# Patient Record
Sex: Female | Born: 1995 | Race: Black or African American | Hispanic: Yes | Marital: Single | Smoking: Never smoker
Health system: Southern US, Community
[De-identification: ages and names within clinical notes are randomized; demographics above are authoritative.]

## PROBLEM LIST (undated history)

## (undated) ENCOUNTER — Inpatient Hospital Stay: Payer: Self-pay

## (undated) DIAGNOSIS — R569 Unspecified convulsions: Secondary | ICD-10-CM

## (undated) DIAGNOSIS — G43909 Migraine, unspecified, not intractable, without status migrainosus: Secondary | ICD-10-CM

## (undated) DIAGNOSIS — K219 Gastro-esophageal reflux disease without esophagitis: Secondary | ICD-10-CM

## (undated) DIAGNOSIS — J45909 Unspecified asthma, uncomplicated: Secondary | ICD-10-CM

## (undated) DIAGNOSIS — K8020 Calculus of gallbladder without cholecystitis without obstruction: Secondary | ICD-10-CM

## (undated) HISTORY — PX: WISDOM TOOTH EXTRACTION: SHX21

---
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
## 2016-04-13 ENCOUNTER — Emergency Department: Payer: Medicaid Other

## 2016-04-13 ENCOUNTER — Emergency Department
Admission: EM | Admit: 2016-04-13 | Discharge: 2016-04-13 | Disposition: A | Discharge status: Home / Self Care | Payer: Medicaid Other | Attending: Emergency Medicine | Admitting: Emergency Medicine

## 2016-04-13 ENCOUNTER — Encounter: Payer: Self-pay | Admitting: Emergency Medicine

## 2016-04-13 DIAGNOSIS — T148XXA Other injury of unspecified body region, initial encounter: Secondary | ICD-10-CM

## 2016-04-13 DIAGNOSIS — Y92481 Parking lot as the place of occurrence of the external cause: Secondary | ICD-10-CM | POA: Diagnosis not present

## 2016-04-13 DIAGNOSIS — S39011A Strain of muscle, fascia and tendon of abdomen, initial encounter: Secondary | ICD-10-CM | POA: Insufficient documentation

## 2016-04-13 DIAGNOSIS — J45909 Unspecified asthma, uncomplicated: Secondary | ICD-10-CM | POA: Diagnosis not present

## 2016-04-13 DIAGNOSIS — R51 Headache: Secondary | ICD-10-CM | POA: Diagnosis present

## 2016-04-13 DIAGNOSIS — Y939 Activity, unspecified: Secondary | ICD-10-CM | POA: Insufficient documentation

## 2016-04-13 DIAGNOSIS — Z91040 Latex allergy status: Secondary | ICD-10-CM | POA: Insufficient documentation

## 2016-04-13 DIAGNOSIS — Y999 Unspecified external cause status: Secondary | ICD-10-CM | POA: Diagnosis not present
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
## 2016-04-13 NOTE — ED Notes (Signed)
Patient transported to CT 

## 2016-06-02 ENCOUNTER — Emergency Department
Admission: EM | Admit: 2016-06-02 | Discharge: 2016-06-02 | Disposition: A | Discharge status: Home / Self Care | Payer: Medicaid Other | Attending: Emergency Medicine | Admitting: Emergency Medicine

## 2016-06-02 ENCOUNTER — Encounter: Payer: Self-pay | Admitting: Emergency Medicine

## 2016-06-02 DIAGNOSIS — R112 Nausea with vomiting, unspecified: Secondary | ICD-10-CM | POA: Insufficient documentation

## 2016-06-02 DIAGNOSIS — Z5321 Procedure and treatment not carried out due to patient leaving prior to being seen by health care provider: Secondary | ICD-10-CM | POA: Insufficient documentation

## 2016-06-02 DIAGNOSIS — R109 Unspecified abdominal pain: Secondary | ICD-10-CM | POA: Diagnosis not present

## 2016-06-02 LAB — POCT PREGNANCY, URINE: Preg Test, Ur: POSITIVE — AB

## 2016-06-02 NOTE — ED Notes (Signed)
Attempted to call from the lobby , no respsonse

## 2016-06-02 NOTE — ED Notes (Signed)
Called from the lobby for room assignment, no response  

## 2016-06-02 NOTE — ED Triage Notes (Signed)
Pt to ED c/o abd pain x1 week, nausea and vomiting x2 today.  Pt reports last period November 28th, first possible pregnancy with positive pregnancy test at home.

## 2016-06-08 ENCOUNTER — Emergency Department
Admission: EM | Admit: 2016-06-08 | Discharge: 2016-06-08 | Disposition: A | Discharge status: Home / Self Care | Payer: Medicaid Other | Attending: Emergency Medicine | Admitting: Emergency Medicine

## 2016-06-08 ENCOUNTER — Emergency Department: Payer: Medicaid Other

## 2016-06-08 DIAGNOSIS — B9689 Other specified bacterial agents as the cause of diseases classified elsewhere: Secondary | ICD-10-CM

## 2016-06-08 DIAGNOSIS — R102 Pelvic and perineal pain: Secondary | ICD-10-CM | POA: Diagnosis not present

## 2016-06-08 DIAGNOSIS — J45909 Unspecified asthma, uncomplicated: Secondary | ICD-10-CM | POA: Diagnosis not present

## 2016-06-08 DIAGNOSIS — Z79899 Other long term (current) drug therapy: Secondary | ICD-10-CM | POA: Insufficient documentation

## 2016-06-08 DIAGNOSIS — R109 Unspecified abdominal pain: Secondary | ICD-10-CM

## 2016-06-08 DIAGNOSIS — Z91040 Latex allergy status: Secondary | ICD-10-CM | POA: Diagnosis not present

## 2016-06-08 DIAGNOSIS — O23591 Infection of other part of genital tract in pregnancy, first trimester: Secondary | ICD-10-CM | POA: Insufficient documentation

## 2016-06-08 DIAGNOSIS — N760 Acute vaginitis: Secondary | ICD-10-CM

## 2016-06-08 DIAGNOSIS — O26899 Other specified pregnancy related conditions, unspecified trimester: Secondary | ICD-10-CM

## 2016-06-08 DIAGNOSIS — Z3A01 Less than 8 weeks gestation of pregnancy: Secondary | ICD-10-CM | POA: Diagnosis not present

## 2016-06-08 DIAGNOSIS — O26891 Other specified pregnancy related conditions, first trimester: Secondary | ICD-10-CM | POA: Diagnosis present

## 2016-06-08 LAB — COMPREHENSIVE METABOLIC PANEL
ALBUMIN: 4.3 g/dL (ref 3.5–5.0)
ALK PHOS: 78 U/L (ref 38–126)
ALT: 12 U/L — AB (ref 14–54)
AST: 20 U/L (ref 15–41)
Anion gap: 8 (ref 5–15)
BILIRUBIN TOTAL: 0.7 mg/dL (ref 0.3–1.2)
BUN: 11 mg/dL (ref 6–20)
CALCIUM: 9.4 mg/dL (ref 8.9–10.3)
CO2: 24 mmol/L (ref 22–32)
Chloride: 104 mmol/L (ref 101–111)
Creatinine, Ser: 0.81 mg/dL (ref 0.44–1.00)
GFR calc Af Amer: >60 mL/min (ref >60)
GFR calc non Af Amer: >60 mL/min (ref >60)
GLUCOSE: 83 mg/dL (ref 65–99)
Potassium: 4.2 mmol/L (ref 3.5–5.1)
SODIUM: 136 mmol/L (ref 135–145)
TOTAL PROTEIN: 8.1 g/dL (ref 6.5–8.1)

## 2016-06-08 LAB — WET PREP, GENITAL
Sperm: NONE SEEN
Trich, Wet Prep: NONE SEEN
YEAST WET PREP: NONE SEEN

## 2016-06-08 LAB — CBC
HCT: 40.1 % (ref 35.0–47.0)
Hemoglobin: 14.2 g/dL (ref 12.0–16.0)
MCH: 28.5 pg (ref 26.0–34.0)
MCHC: 35.4 g/dL (ref 32.0–36.0)
MCV: 80.3 fL (ref 80.0–100.0)
Platelets: 288 10*3/uL (ref 150–440)
RBC: 4.99 MIL/uL (ref 3.80–5.20)
RDW: 13.7 % (ref 11.5–14.5)
WBC: 7.6 10*3/uL (ref 3.6–11.0)

## 2016-06-08 LAB — PREGNANCY, URINE: Preg Test, Ur: POSITIVE — AB

## 2016-06-08 LAB — URINALYSIS, COMPLETE (UACMP) WITH MICROSCOPIC
BACTERIA UA: NONE SEEN
Bilirubin Urine: NEGATIVE
Glucose, UA: NEGATIVE mg/dL
HGB URINE DIPSTICK: NEGATIVE
Ketones, ur: NEGATIVE mg/dL
Leukocytes, UA: NEGATIVE
NITRITE: NEGATIVE
PROTEIN: NEGATIVE mg/dL
SPECIFIC GRAVITY, URINE: 1.014 (ref 1.005–1.030)
pH: 6 (ref 5.0–8.0)

## 2016-06-08 LAB — HCG, QUANTITATIVE, PREGNANCY: HCG, BETA CHAIN, QUANT, S: 11960 m[IU]/mL — AB (ref <5)

## 2016-06-08 LAB — CHLAMYDIA/NGC RT PCR (ARMC ONLY)
CHLAMYDIA TR: NOT DETECTED
N GONORRHOEAE: NOT DETECTED

## 2016-06-08 LAB — ABO/RH: ABO/RH(D): O POS

## 2016-06-08 MED ORDER — SODIUM CHLORIDE 0.9 % IV BOLUS (SEPSIS)
1000.0000 mL | Freq: Once | INTRAVENOUS | Status: AC
  Administered 2016-06-08: 1000 mL via INTRAVENOUS

## 2016-06-08 MED ORDER — METRONIDAZOLE 500 MG PO TABS: 500.0000 mg | ORAL_TABLET | Freq: Two times a day (BID) | ORAL | 0 refills | Status: DC

## 2016-06-08 MED ORDER — DOXYLAMINE-PYRIDOXINE 10-10 MG PO TBEC: DELAYED_RELEASE_TABLET | ORAL | 0 refills | Status: DC

## 2016-06-08 NOTE — ED Triage Notes (Addendum)
Abdominal cramping and nausea x 3 days. Pt approx [redacted] weeks pregnant. No vaginal bleeding. Pt alert and oriented X4, active, cooperative, pt in NAD. RR even and unlabored, color WNL.

## 2016-06-08 NOTE — ED Notes (Signed)
Patient transported to Ultrasound 
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
## 2016-06-18 ENCOUNTER — Emergency Department
Admission: EM | Admit: 2016-06-18 | Discharge: 2016-06-18 | Disposition: A | Discharge status: Home / Self Care | Payer: Medicaid Other | Attending: Emergency Medicine | Admitting: Emergency Medicine

## 2016-06-18 DIAGNOSIS — Z3A09 9 weeks gestation of pregnancy: Secondary | ICD-10-CM | POA: Diagnosis not present

## 2016-06-18 DIAGNOSIS — O219 Vomiting of pregnancy, unspecified: Secondary | ICD-10-CM | POA: Diagnosis not present

## 2016-06-18 DIAGNOSIS — Z5321 Procedure and treatment not carried out due to patient leaving prior to being seen by health care provider: Secondary | ICD-10-CM | POA: Diagnosis not present

## 2016-06-18 DIAGNOSIS — J45909 Unspecified asthma, uncomplicated: Secondary | ICD-10-CM | POA: Insufficient documentation

## 2016-06-18 DIAGNOSIS — R05 Cough: Secondary | ICD-10-CM | POA: Diagnosis not present
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
## 2016-06-18 NOTE — ED Triage Notes (Signed)
Nausea intermittently X 2 days. Pt approx [redacted] weeks pregnant. Emesis X 1.

## 2016-06-20 DIAGNOSIS — R569 Unspecified convulsions: Secondary | ICD-10-CM | POA: Insufficient documentation

## 2016-06-23 DIAGNOSIS — E669 Obesity, unspecified: Secondary | ICD-10-CM | POA: Insufficient documentation

## 2016-08-05 DIAGNOSIS — Z3A13 13 weeks gestation of pregnancy: Secondary | ICD-10-CM | POA: Insufficient documentation

## 2016-08-05 DIAGNOSIS — Z79899 Other long term (current) drug therapy: Secondary | ICD-10-CM | POA: Insufficient documentation

## 2016-08-05 DIAGNOSIS — J45909 Unspecified asthma, uncomplicated: Secondary | ICD-10-CM | POA: Diagnosis not present

## 2016-08-05 DIAGNOSIS — R109 Unspecified abdominal pain: Secondary | ICD-10-CM | POA: Diagnosis not present

## 2016-08-05 DIAGNOSIS — O9989 Other specified diseases and conditions complicating pregnancy, childbirth and the puerperium: Secondary | ICD-10-CM | POA: Diagnosis present

## 2016-08-06 ENCOUNTER — Emergency Department
Admission: EM | Admit: 2016-08-06 | Discharge: 2016-08-06 | Discharge status: Against Medical Advice | Payer: Medicaid Other | Attending: Emergency Medicine | Admitting: Emergency Medicine

## 2016-08-06 ENCOUNTER — Emergency Department: Payer: Medicaid Other

## 2016-08-06 DIAGNOSIS — R1084 Generalized abdominal pain: Secondary | ICD-10-CM

## 2016-08-06 DIAGNOSIS — Z3492 Encounter for supervision of normal pregnancy, unspecified, second trimester: Secondary | ICD-10-CM

## 2016-08-06 LAB — URINALYSIS, COMPLETE (UACMP) WITH MICROSCOPIC
BILIRUBIN URINE: NEGATIVE
GLUCOSE, UA: NEGATIVE mg/dL
Hgb urine dipstick: NEGATIVE
Ketones, ur: NEGATIVE mg/dL
LEUKOCYTES UA: NEGATIVE
Nitrite: NEGATIVE
PROTEIN: NEGATIVE mg/dL
SPECIFIC GRAVITY, URINE: 1.028 (ref 1.005–1.030)
pH: 5 (ref 5.0–8.0)

## 2016-08-06 LAB — CBC
HCT: 36.3 % (ref 35.0–47.0)
Hemoglobin: 12.8 g/dL (ref 12.0–16.0)
MCH: 28.4 pg (ref 26.0–34.0)
MCHC: 35.4 g/dL (ref 32.0–36.0)
MCV: 80.3 fL (ref 80.0–100.0)
PLATELETS: 227 10*3/uL (ref 150–440)
RBC: 4.52 MIL/uL (ref 3.80–5.20)
RDW: 13.4 % (ref 11.5–14.5)
WBC: 7.9 10*3/uL (ref 3.6–11.0)

## 2016-08-06 LAB — LIPASE, BLOOD: Lipase: 21 U/L (ref 11–51)

## 2016-08-06 LAB — COMPREHENSIVE METABOLIC PANEL
ALT: 20 U/L (ref 14–54)
ANION GAP: 5 (ref 5–15)
AST: 25 U/L (ref 15–41)
Albumin: 3.6 g/dL (ref 3.5–5.0)
Alkaline Phosphatase: 74 U/L (ref 38–126)
BUN: 10 mg/dL (ref 6–20)
CHLORIDE: 105 mmol/L (ref 101–111)
CO2: 25 mmol/L (ref 22–32)
Calcium: 8.7 mg/dL — ABNORMAL LOW (ref 8.9–10.3)
Creatinine, Ser: 0.76 mg/dL (ref 0.44–1.00)
GFR calc non Af Amer: >60 mL/min (ref >60)
GLUCOSE: 67 mg/dL (ref 65–99)
POTASSIUM: 3.8 mmol/L (ref 3.5–5.1)
SODIUM: 135 mmol/L (ref 135–145)
Total Bilirubin: <0.1 mg/dL — ABNORMAL LOW (ref 0.3–1.2)
Total Protein: 7.4 g/dL (ref 6.5–8.1)

## 2016-08-06 LAB — POCT PREGNANCY, URINE: PREG TEST UR: POSITIVE — AB

## 2016-08-06 LAB — HCG, QUANTITATIVE, PREGNANCY: hCG, Beta Chain, Quant, S: 86882 m[IU]/mL — ABNORMAL HIGH (ref <5)

## 2016-08-06 NOTE — ED Triage Notes (Signed)
Pt in with co mid abd pain x 1 week, vomited x 1 today. Pt denies any dysuria or diarrhea.

## 2016-08-06 NOTE — ED Provider Notes (Signed)
[facility] Emergency Department Provider Note   First MD Initiated Contact with Patient 08/06/16 0207     (approximate)  I have reviewed the triage vital signs and the nursing notes.   HISTORY  Chief Complaint Abdominal Pain   HPI Candice Lewis is a 21 y.o. female presents to the emergency department with complaint of mid abdominal pain times one week associated 1 episode of vomiting today. I asked patient if she had any history of pregnancies in the past which she stated "no". I asked the patient if there is any possibility that she might be pregnant at present to which the patient responded "no". I asked the patient regarding her visit to the emergency department on January 8 where she was noted to be pregnant with an ultrasound which showed yolk sac which the patient states that she has no recollection of that visit. Patient states that she has no recollection of the ultrasound. I asked the patient if she is followed up with OB/GYN since that visit to which she responded no". She states her current pain score 6 out of 10. History was obtained with Vanice SarahKaley RN at bedside.   Past Medical History:  Diagnosis Date  . Asthma   . Migraine   . Migraine     There are no active problems to display for this patient.   No past surgical history on file.  Prior to Admission medications   Medication Sig Start Date End Date Taking? Authorizing Provider  acetaminophen-codeine (TYLENOL #3) 300-30 MG tablet Take 1-2 tablets by mouth every 4 (four) hours as needed for moderate pain. Patient not taking: Reported on 06/08/2016 09/28/15   Chinita Pesterari B Triplett, FNP  amoxicillin (AMOXIL) 500 MG tablet Take 1 tablet (500 mg total) by mouth 2 (two) times daily. Patient not taking: Reported on 06/08/2016 09/28/15   Chinita Pesterari B Triplett, FNP  butalbital-acetaminophen-caffeine (FIORICET) 50-325-40 MG per tablet Take 1-2 tablets by mouth every 6 (six) hours as needed for headache. Patient not  taking: Reported on 06/08/2016 11/04/14   Joni Reiningonald K Smith, PA-C  Doxylamine-Pyridoxine (DICLEGIS) 10-10 MG TBEC 2 tabs orally at hs (Day 1). If this works, continue taking 2 tab qhs. However, if nausea persists Day 2, take 2 tabs at bedtime that night then take three tablets starting on Day 3 (one tablet in the morning and two tablets at bedtime). If these 3 tablets control symptoms on Day 4, continue taking 3 tabs daily. Otherwise take 4 tabs starting on Day 4 (one tablet in the morning, one tablet mid-afternoon and two tablets at bedtime). 06/08/16   Jeanmarie PlantJames A McShane, MD  HYDROcodone-acetaminophen (NORCO) 5-325 MG tablet Take 1 tablet by mouth every 6 (six) hours as needed for moderate pain. Patient not taking: Reported on 06/08/2016 11/27/15   Irean HongJade J Sung, MD  hydrOXYzine (ATARAX/VISTARIL) 50 MG tablet Take 1 tablet (50 mg total) by mouth 3 (three) times daily as needed for itching. Patient not taking: Reported on 06/08/2016 02/03/15   Joni Reiningonald K Smith, PA-C  hydrOXYzine (ATARAX/VISTARIL) 50 MG tablet Take 1 tablet (50 mg total) by mouth 3 (three) times daily as needed for itching. Patient not taking: Reported on 06/08/2016 08/29/15   Joni Reiningonald K Smith, PA-C  ibuprofen (ADVIL,MOTRIN) 800 MG tablet Take 1 tablet (800 mg total) by mouth every 8 (eight) hours as needed for moderate pain. Patient not taking: Reported on 06/08/2016 11/27/15   Irean HongJade J Sung, MD  lidocaine (XYLOCAINE) 2 % solution Use as directed 20  mLs in the mouth or throat as needed for mouth pain. Patient not taking: Reported on 06/08/2016 11/23/14   Evangeline Dakin, PA-C  methylPREDNISolone (MEDROL DOSEPAK) 4 MG TBPK tablet Take Tapered dose as directed Patient not taking: Reported on 06/08/2016 02/03/15   Joni Reining, PA-C  metroNIDAZOLE (FLAGYL) 500 MG tablet Take 1 tablet (500 mg total) by mouth 2 (two) times daily. 06/08/16   Jeanmarie Plant, MD  naproxen (NAPROSYN) 500 MG tablet Take 1 tablet (500 mg total) by mouth 2 (two) times daily with a  meal. Patient not taking: Reported on 06/08/2016 07/17/15   Jami L Hagler, PA-C  Prenatal Vit-Fe Fumarate-FA (PRENATAL MULTIVITAMIN) TABS tablet Take 1 tablet by mouth daily at 12 noon.    Historical Provider, MD  sulfamethoxazole-trimethoprim (BACTRIM DS,SEPTRA DS) 800-160 MG tablet Take 1 tablet by mouth 2 (two) times daily. Patient not taking: Reported on 06/08/2016 08/29/15   Joni Reining, PA-C  traMADol (ULTRAM) 50 MG tablet Take 1 tablet (50 mg total) by mouth every 6 (six) hours as needed. Patient not taking: Reported on 06/08/2016 08/29/15 08/28/16  Joni Reining, PA-C    Allergies Zofran Frazier Richards hcl] and Latex  No family history on file.  Social History Social History  Substance Use Topics  . Smoking status: Never Smoker  . Smokeless tobacco: Never Used  . Alcohol use No    Review of Systems Constitutional: No fever/chills Eyes: No visual changes. ENT: No sore throat. Cardiovascular: Denies chest pain. Respiratory: Denies shortness of breath. Gastrointestinal: No abdominal pain.  No nausea, no vomiting.  No diarrhea.  No constipation. Genitourinary: Negative for dysuria. Musculoskeletal: Negative for back pain. Skin: Negative for rash. Neurological: Negative for headaches, focal weakness or numbness.  10-point ROS otherwise negative.  ____________________________________________   PHYSICAL EXAM:  VITAL SIGNS: ED Triage Vitals [08/06/16 0002]  Enc Vitals Group     BP 115/70     Pulse Rate 79     Resp 18     Temp 98.1 F (36.7 C)     Temp Source Oral     SpO2 100 %     Weight 155 lb (70.3 kg)     Height 5' (1.524 m)     Head Circumference      Peak Flow      Pain Score 6     Pain Loc      Pain Edu?      Excl. in GC?     Constitutional: Alert and oriented. Well appearing and in no acute distress. Eyes: Conjunctivae are normal. PERRL. EOMI. Head: Atraumatic. Mouth/Throat: Mucous membranes are moist. Oropharynx non-erythematous. Neck: No stridor.    Cardiovascular: Normal rate, regular rhythm. Good peripheral circulation. Grossly normal heart sounds. Respiratory: Normal respiratory effort.  No retractions. Lungs CTAB. Gastrointestinal: Soft and nontender. No distention.  Genitourinary: Not performed this patient left AMA before completion of evaluation. Musculoskeletal: No lower extremity tenderness nor edema. No gross deformities of extremities. Neurologic:  Normal speech and language. No gross focal neurologic deficits are appreciated.  Skin:  Skin is warm, dry and intact. No rash noted. Psychiatric: Mood and affect are normal. Speech and behavior are normal.  ____________________________________________   LABS (all labs ordered are listed, but only abnormal results are displayed)  Labs Reviewed  COMPREHENSIVE METABOLIC PANEL - Abnormal; Notable for the following:       Result Value   Calcium 8.7 (*)    Total Bilirubin <0.1 (*)    All other  components within normal limits  URINALYSIS, COMPLETE (UACMP) WITH MICROSCOPIC - Abnormal; Notable for the following:    Color, Urine YELLOW (*)    APPearance CLEAR (*)    Bacteria, UA RARE (*)    Squamous Epithelial / LPF 6-30 (*)    All other components within normal limits  HCG, QUANTITATIVE, PREGNANCY - Abnormal; Notable for the following:    hCG, Beta Chain, Sharene Butters, [place] 16,109 (*)    All other components within normal limits  POCT PREGNANCY, URINE - Abnormal; Notable for the following:    Preg Test, Ur POSITIVE (*)    All other components within normal limits  CBC  LIPASE, BLOOD  POC URINE PREG, ED    Procedures    INITIAL IMPRESSION / ASSESSMENT AND PLAN / ED COURSE  Pertinent labs & imaging results that were available during my care of the patient were reviewed by me and considered in my medical decision making (see chart for details).  After ultrasound was performed patient requested to leave AGAINST MEDICAL ADVICE. I spoke with the patient at length informing her of the  dangers of this decision. However the patient requested to leave AMA. I stressed the importance to the patient and following up with OB/GYN. Patient also states she started taking prenatal multivitamins a few weeks ago when asked why she decided to be that she said because her belly was hurting and she had not had her period.      ____________________________________________  FINAL CLINICAL IMPRESSION(S) / ED DIAGNOSES  Final diagnoses:  Second trimester pregnancy     MEDICATIONS GIVEN DURING THIS VISIT:  Medications - No data to display   NEW OUTPATIENT MEDICATIONS STARTED DURING THIS VISIT:  New Prescriptions   No medications on file    Modified Medications   No medications on file    Discontinued Medications   No medications on file     Note:  This document was prepared using Dragon voice recognition software and may include unintentional dictation errors.    Darci Current, MD 08/06/16 [phone]

## 2016-08-06 NOTE — ED Notes (Signed)
Patient transported to Ultrasound 

## 2016-08-06 NOTE — ED Notes (Signed)
PT seen in hall walking out. Pt asked where she was going pt states she has to go. Pt made aware her results were not back and pt needed to wait. Pt refused saying she didn't care to wait. MD made aware and went to bedside.

## 2016-08-06 NOTE — ED Notes (Signed)
Pt back from ultrasound.

## 2016-09-15 IMAGING — CR DG ABDOMEN 3V
1 series · 3 of 3 positions shown · non-contrast
Comparison: Chest radiograph 08/10/2014, CT abdomen 02/08/2014

CLINICAL DATA: Lower abdominal pain, vomiting for 3 weeks.

EXAM:
ABDOMEN SERIES

[Series 1: dxr abdomen 3-way (incl pa cxr) · 0.14mm/px · 3 of 3 slices shown]
[im 1/3]
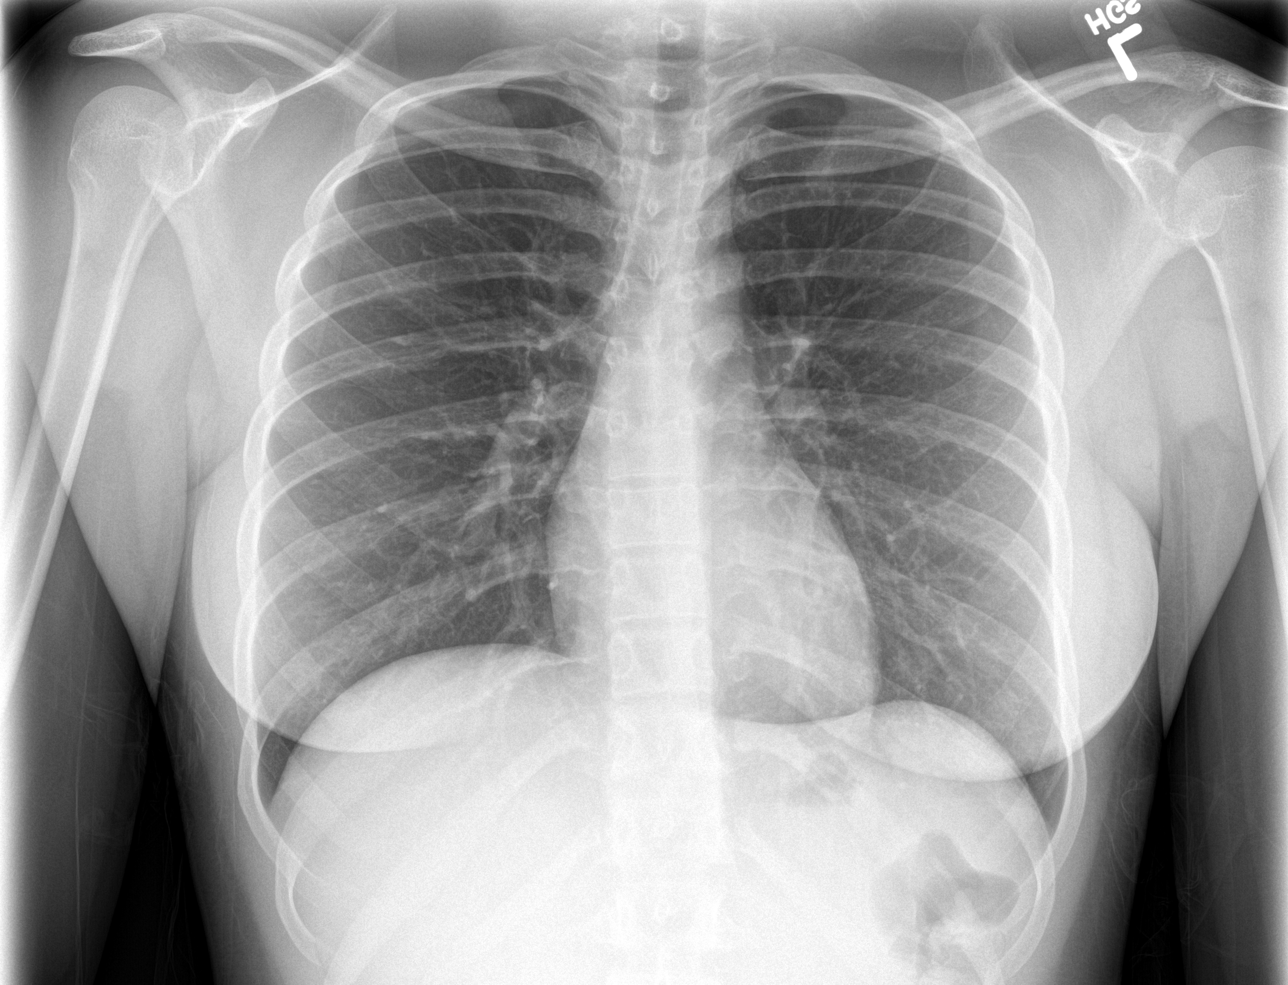
[im 2/3]
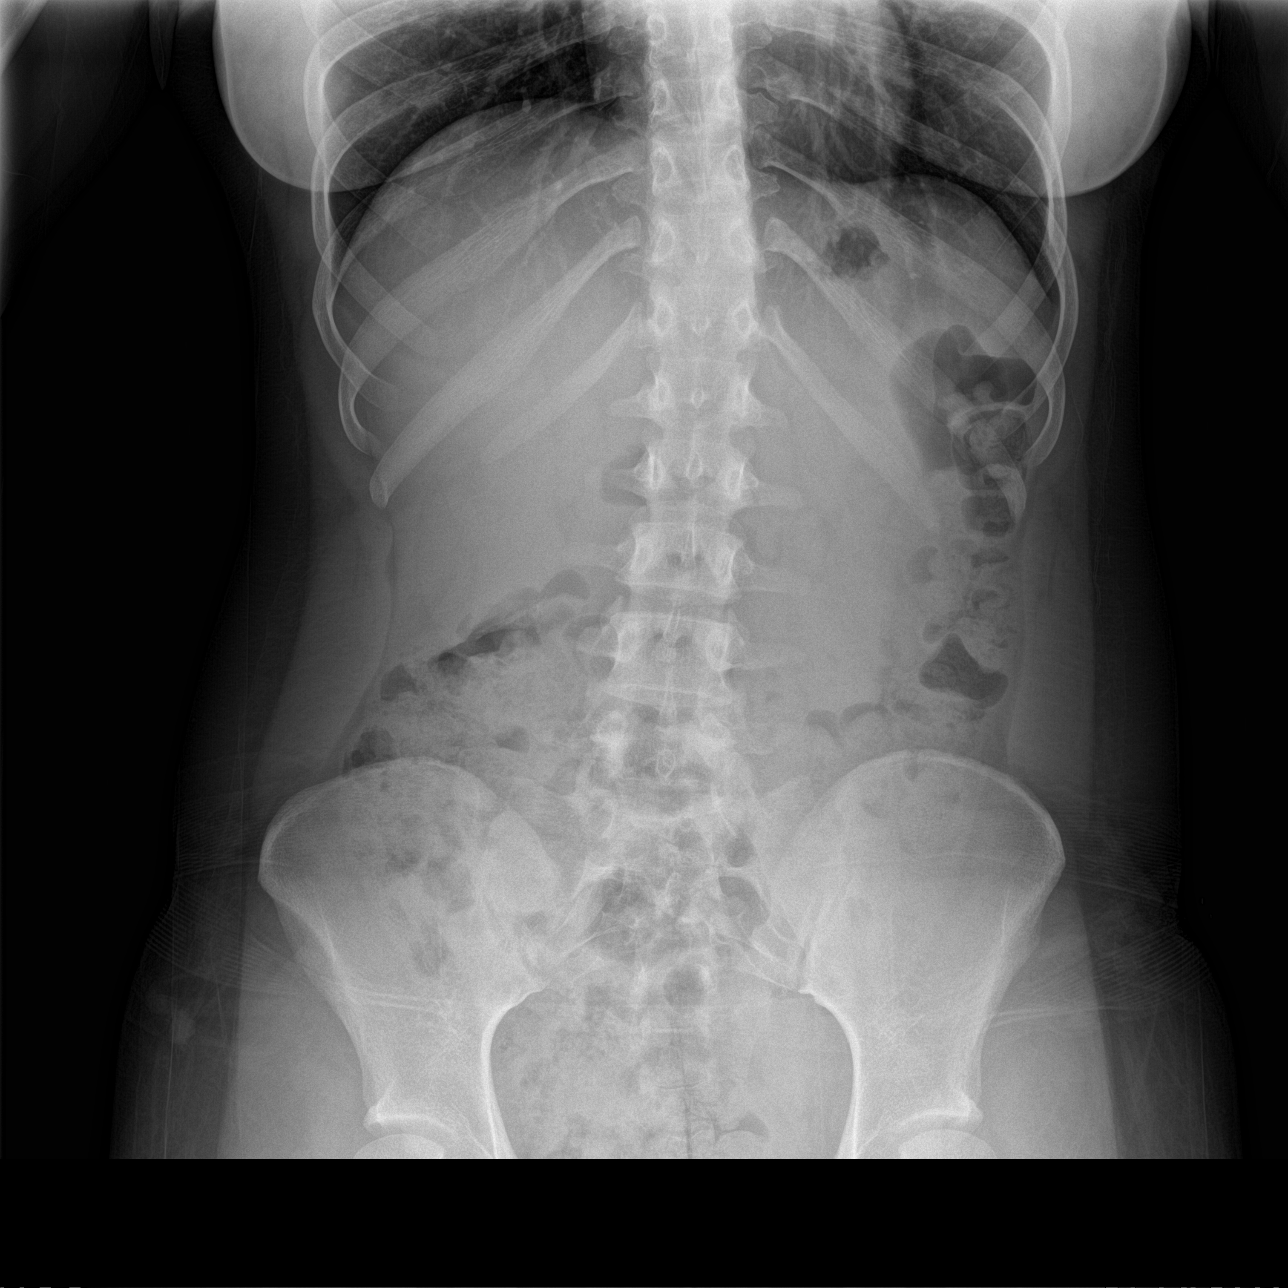
[im 3/3]
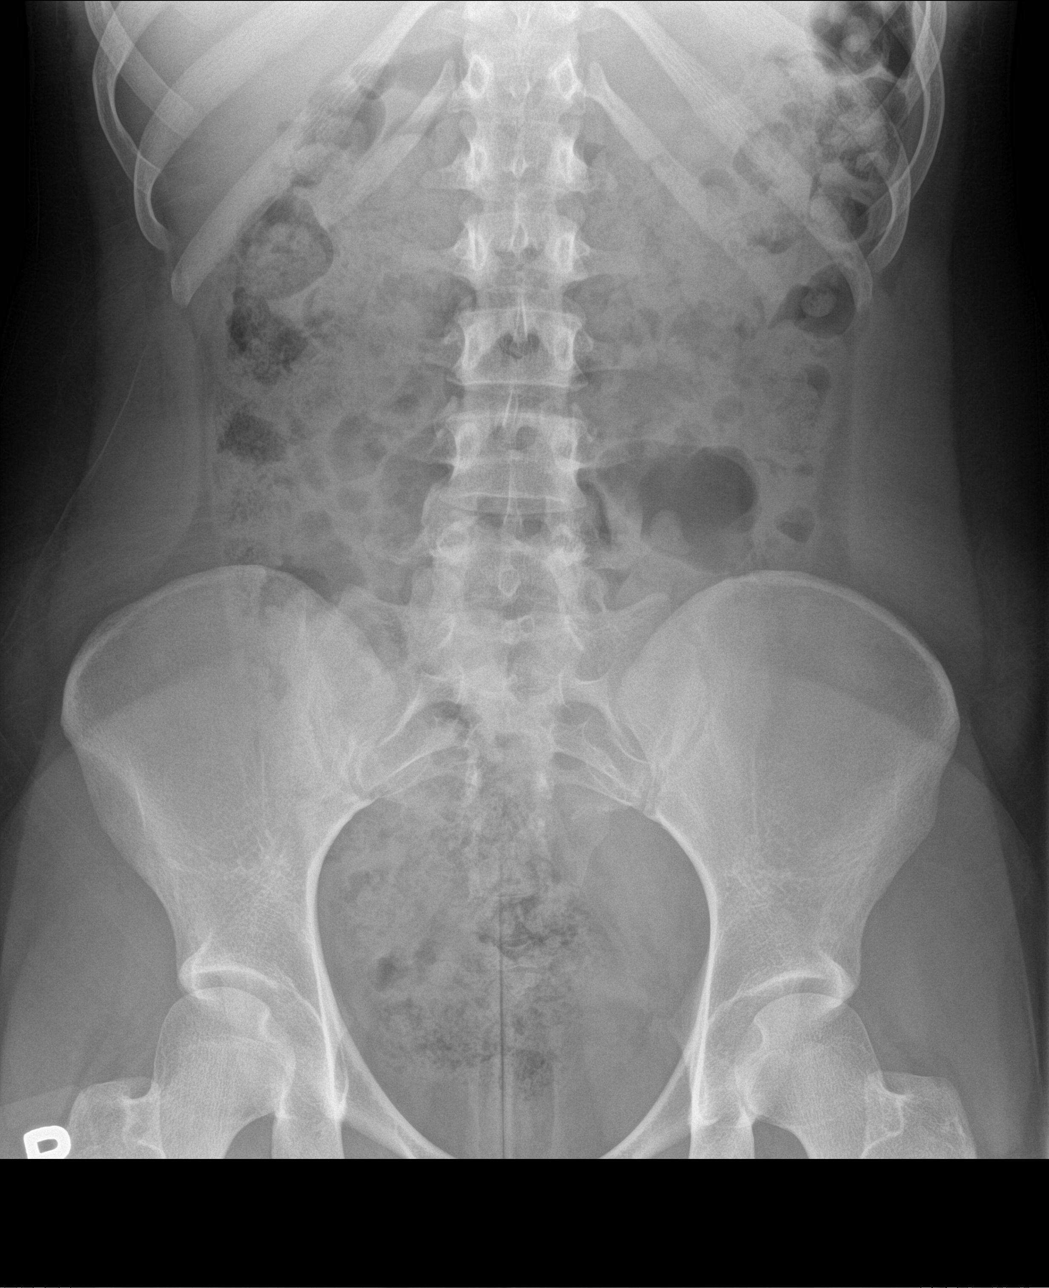

[3 of 3 positions shown; findings below may reference images not displayed]

FINDINGS: Normal cardiac silhouette. Lungs are clear. No free air beneath
hemidiaphragm. No dilated loops of large or small bowel. There is a
moderate volume stool in the right colon and rectum. No pathologic
calcifications. Mild levoscoliosis lumbar spine.
IMPRESSION: 1. No acute cardiopulmonary findings.
2. No bowel obstruction or intraperitoneal free air.
3. Moderate volume stool in the right colon.
4. Mild scoliosis

## 2016-12-17 ENCOUNTER — Observation Stay
Admission: EM | Admit: 2016-12-17 | Discharge: 2016-12-17 | Disposition: A | Discharge status: Home / Self Care | Payer: Medicaid Other | Attending: Obstetrics & Gynecology | Admitting: Obstetrics & Gynecology

## 2016-12-17 DIAGNOSIS — O471 False labor at or after 37 completed weeks of gestation: Secondary | ICD-10-CM | POA: Diagnosis present

## 2016-12-17 DIAGNOSIS — Z3490 Encounter for supervision of normal pregnancy, unspecified, unspecified trimester: Secondary | ICD-10-CM

## 2016-12-17 DIAGNOSIS — Z3A35 35 weeks gestation of pregnancy: Secondary | ICD-10-CM | POA: Diagnosis not present

## 2016-12-17 LAB — URINALYSIS, COMPLETE (UACMP) WITH MICROSCOPIC
Bacteria, UA: NONE SEEN
Bilirubin Urine: NEGATIVE
GLUCOSE, UA: NEGATIVE mg/dL
Hgb urine dipstick: NEGATIVE
Ketones, ur: NEGATIVE mg/dL
Leukocytes, UA: NEGATIVE
NITRITE: NEGATIVE
PROTEIN: NEGATIVE mg/dL
RBC / HPF: NONE SEEN RBC/hpf (ref 0–5)
Specific Gravity, Urine: 1.013 (ref 1.005–1.030)
pH: 7 (ref 5.0–8.0)

## 2016-12-17 MED ORDER — ACETAMINOPHEN 500 MG PO TABS
1000.0000 mg | ORAL_TABLET | Freq: Once | ORAL | Status: AC
  Administered 2016-12-17: 1000 mg via ORAL
  Filled 2016-12-17: qty 2

## 2016-12-17 NOTE — Discharge Instructions (Signed)
Follow up with OB provider.  Come back if:  Big gush of fluids Decreased fetal movement Temp over 100.4 Decreased fetal movement Contractions every 3-5 min lasting at least one hour  Get plenty of rest and stay well hydrated!

## 2016-12-25 ENCOUNTER — Encounter: Payer: Self-pay | Admitting: Obstetrics & Gynecology

## 2016-12-25 NOTE — Discharge Summary (Signed)
Patient's last menstrual period was 05/22/2016. EDC: 12/17/2016 EGA: 5553w6d  Patient presented for evaluation of contractions. She was placed on the monitor and there were no contractions occurring.  RN reported that patient's discomfort had resolved after presentation. I reviewed her vital signs and fetal tracing, both of which were reassuring.  Patient was discharged as she was not laboring.  NST interpretation: Reactive.  Ranae Plumberhelsea Ward, MD Attending Obstetrician and Gynecologist [facility]

## 2017-01-09 DIAGNOSIS — B951 Streptococcus, group B, as the cause of diseases classified elsewhere: Secondary | ICD-10-CM | POA: Insufficient documentation

## 2017-01-22 ENCOUNTER — Inpatient Hospital Stay
Admission: EM | Admit: 2017-01-22 | Discharge: 2017-01-22 | Disposition: A | Discharge status: Home / Self Care | Payer: Medicaid Other | Attending: Obstetrics and Gynecology | Admitting: Obstetrics and Gynecology

## 2017-01-22 DIAGNOSIS — O479 False labor, unspecified: Secondary | ICD-10-CM

## 2017-01-22 MED ORDER — PRENATAL MULTIVITAMIN CH: 1.0000 | ORAL_TABLET | Freq: Every day | ORAL | Status: DC

## 2017-01-22 MED ORDER — ZOLPIDEM TARTRATE 5 MG PO TABS: 5.0000 mg | ORAL_TABLET | Freq: Every evening | ORAL | Status: DC | PRN

## 2017-01-22 MED ORDER — CALCIUM CARBONATE ANTACID 500 MG PO CHEW: 2.0000 | CHEWABLE_TABLET | ORAL | Status: DC | PRN

## 2017-01-22 MED ORDER — ACETAMINOPHEN 325 MG PO TABS: 650.0000 mg | ORAL_TABLET | ORAL | Status: DC | PRN

## 2017-01-22 MED ORDER — DOCUSATE SODIUM 100 MG PO CAPS: 100.0000 mg | ORAL_CAPSULE | Freq: Every day | ORAL | Status: DC

## 2017-01-22 NOTE — Discharge Summary (Signed)
TRIAGE VISIT with NST   Candice Lewis is a 21 y.o. G1P0. She is at 8733w4d gestation, presenting with signs of labor.  Indication: Contractions  S: Resting comfortably. Irregular CTX, no VB. Active fetal movement. O:  BP 113/73   Pulse (!) 101   Temp 98.6 F (37 C) (Oral)   Resp 18   Ht 5' (1.524 m)   Wt 203 lb (92.1 kg)   LMP 05/22/2016   BMI 39.65 kg/m  No results found for this or any previous visit (from the past 48 hour(s)).   Gen: NAD, AAOx3      Abd: FNTTP      Ext: Non-tender, Nonedmeatous    NST/FHT: 150, mod var, +accels, no decels TOCO: intermittent MVH:QIONGEXBSVE:Dilation: 2.5 Effacement (%): 70 Cervical Position: Middle Station: -2 Exam by:: [facility]  NST: Reactive. See FHT above for particulars.  A/P:  21 y.o. G1P0 233w4d with occasional painful contractions.   Labor: not present.   D/c home stable, precautions reviewed, follow-up as scheduled.

## 2017-01-22 NOTE — Progress Notes (Signed)
Dr. Dalbert GarnetBeasley present to evaluate pt.  Repeat SVE completed.  Plan of care discussed with and discharge instructions given by Dr. Dalbert GarnetBeasley at this time.  Pt agrees with plan of care and understands discharge instructions.  No needs voiced at this time.

## 2017-01-22 NOTE — Progress Notes (Signed)
Pt discharged home ambulatory in stable conditions accompanied by friend

## 2017-01-22 NOTE — OB Triage Note (Signed)
Recvd to OBS 3 with c/o contractions since 0300 this AM getting stronger and more intense throughout the day.  Changed to gown and to bed. Plan of care explained and oriented to room.  Agrees with plan and verbalized understanding.

## 2017-02-07 ENCOUNTER — Emergency Department: Payer: Medicaid Other

## 2017-02-07 ENCOUNTER — Emergency Department
Admission: EM | Admit: 2017-02-07 | Discharge: 2017-02-08 | Disposition: A | Discharge status: Home / Self Care | Payer: Medicaid Other | Attending: Emergency Medicine | Admitting: Emergency Medicine

## 2017-02-07 DIAGNOSIS — Z91040 Latex allergy status: Secondary | ICD-10-CM | POA: Insufficient documentation

## 2017-02-07 DIAGNOSIS — R109 Unspecified abdominal pain: Secondary | ICD-10-CM

## 2017-02-07 DIAGNOSIS — N3001 Acute cystitis with hematuria: Secondary | ICD-10-CM | POA: Insufficient documentation

## 2017-02-07 DIAGNOSIS — K8020 Calculus of gallbladder without cholecystitis without obstruction: Secondary | ICD-10-CM | POA: Insufficient documentation

## 2017-02-07 DIAGNOSIS — Z79899 Other long term (current) drug therapy: Secondary | ICD-10-CM | POA: Diagnosis not present

## 2017-02-07 DIAGNOSIS — R1011 Right upper quadrant pain: Secondary | ICD-10-CM | POA: Diagnosis present

## 2017-02-07 DIAGNOSIS — J45909 Unspecified asthma, uncomplicated: Secondary | ICD-10-CM | POA: Diagnosis not present

## 2017-02-07 LAB — COMPREHENSIVE METABOLIC PANEL
ALK PHOS: 124 U/L (ref 38–126)
ALT: 34 U/L (ref 14–54)
AST: 69 U/L — AB (ref 15–41)
Albumin: 3.2 g/dL — ABNORMAL LOW (ref 3.5–5.0)
Anion gap: 9 (ref 5–15)
BUN: 20 mg/dL (ref 6–20)
CHLORIDE: 110 mmol/L (ref 101–111)
CO2: 23 mmol/L (ref 22–32)
Calcium: 8.7 mg/dL — ABNORMAL LOW (ref 8.9–10.3)
Creatinine, Ser: 1.17 mg/dL — ABNORMAL HIGH (ref 0.44–1.00)
GFR calc Af Amer: >60 mL/min (ref >60)
GLUCOSE: 78 mg/dL (ref 65–99)
POTASSIUM: 3.6 mmol/L (ref 3.5–5.1)
Sodium: 142 mmol/L (ref 135–145)
Total Bilirubin: 0.5 mg/dL (ref 0.3–1.2)
Total Protein: 6.7 g/dL (ref 6.5–8.1)

## 2017-02-07 LAB — LIPASE, BLOOD: LIPASE: 32 U/L (ref 11–51)

## 2017-02-07 LAB — CBC
HEMATOCRIT: 29.2 % — AB (ref 35.0–47.0)
HEMOGLOBIN: 9.9 g/dL — AB (ref 12.0–16.0)
MCH: 26.7 pg (ref 26.0–34.0)
MCHC: 34 g/dL (ref 32.0–36.0)
MCV: 78.6 fL — AB (ref 80.0–100.0)
Platelets: 362 10*3/uL (ref 150–440)
RBC: 3.72 MIL/uL — ABNORMAL LOW (ref 3.80–5.20)
RDW: 15.9 % — ABNORMAL HIGH (ref 11.5–14.5)
WBC: 7.9 10*3/uL (ref 3.6–11.0)

## 2017-02-07 MED ORDER — FENTANYL CITRATE (PF) 100 MCG/2ML IJ SOLN
100.0000 ug | INTRAMUSCULAR | Status: DC | PRN
  Administered 2017-02-07: 100 ug via INTRAVENOUS
  Filled 2017-02-07: qty 2

## 2017-02-07 MED ORDER — SODIUM CHLORIDE 0.9 % IV BOLUS (SEPSIS)
500.0000 mL | Freq: Once | INTRAVENOUS | Status: AC
  Administered 2017-02-07: 500 mL via INTRAVENOUS

## 2017-02-07 MED ORDER — PROMETHAZINE HCL 25 MG/ML IJ SOLN
12.5000 mg | Freq: Four times a day (QID) | INTRAMUSCULAR | Status: DC | PRN
  Filled 2017-02-07: qty 1

## 2017-02-07 NOTE — ED Provider Notes (Signed)
[facility] Emergency Department Provider Note   ____________________________________________   First MD Initiated Contact with Patient 02/07/17 [phone]     (approximate)  I have reviewed the triage vital signs and the nursing notes.   HISTORY  Chief Complaint Abdominal Pain    HPI Candice Lewis is a 21 y.o. female history of asthma and normal spontaneous vaginal delivery on September 2  Patient reports she had a normal delivery, was discharged from the hospital without complication with a healthy newborn baby. She's been doing well, recovering well. This evening she ate a meal and about 30 minutes after began experiencing severe pain in her upper abdomen. She reports pain in the right upper abdomen that radiates slightly into the back. She was told several years ago she did have gallstone problems, but can't specify further reporting these were told her when she was much younger. No fevers or chills. No heavy vaginal bleeding or lower abdominal pain. Reports she's had some very slight spotting which is improving without other discharge.  She is not breast-feeding.    Past Medical History:  Diagnosis Date  . Asthma   . Migraine   . Migraine     Patient Active Problem List   Diagnosis Date Noted  . Pregnancy 12/17/2016    History reviewed. No pertinent surgical history.  Prior to Admission medications   Medication Sig Start Date End Date Taking? Authorizing Provider  acetaminophen-codeine (TYLENOL #3) 300-30 MG tablet Take 1-2 tablets by mouth every 4 (four) hours as needed for moderate pain. Patient not taking: Reported on 06/08/2016 09/28/15   Kem Boroughs B, FNP  amoxicillin (AMOXIL) 500 MG tablet Take 1 tablet (500 mg total) by mouth 2 (two) times daily. Patient not taking: Reported on 06/08/2016 09/28/15   Kem Boroughs B, FNP  butalbital-acetaminophen-caffeine (FIORICET) 50-325-40 MG per tablet Take 1-2 tablets by mouth every 6 (six) hours  as needed for headache. Patient not taking: Reported on 06/08/2016 11/04/14   Joni Reining, PA-C  Doxylamine-Pyridoxine (DICLEGIS) 10-10 MG TBEC 2 tabs orally at hs (Day 1). If this works, continue taking 2 tab qhs. However, if nausea persists Day 2, take 2 tabs at bedtime that night then take three tablets starting on Day 3 (one tablet in the morning and two tablets at bedtime). If these 3 tablets control symptoms on Day 4, continue taking 3 tabs daily. Otherwise take 4 tabs starting on Day 4 (one tablet in the morning, one tablet mid-afternoon and two tablets at bedtime). 06/08/16   Jeanmarie Plant, MD  HYDROcodone-acetaminophen (NORCO) 5-325 MG tablet Take 1 tablet by mouth every 6 (six) hours as needed for moderate pain. Patient not taking: Reported on 06/08/2016 11/27/15   Irean Hong, MD  hydrOXYzine (ATARAX/VISTARIL) 50 MG tablet Take 1 tablet (50 mg total) by mouth 3 (three) times daily as needed for itching. Patient not taking: Reported on 06/08/2016 02/03/15   Joni Reining, PA-C  hydrOXYzine (ATARAX/VISTARIL) 50 MG tablet Take 1 tablet (50 mg total) by mouth 3 (three) times daily as needed for itching. Patient not taking: Reported on 06/08/2016 08/29/15   Joni Reining, PA-C  ibuprofen (ADVIL,MOTRIN) 800 MG tablet Take 1 tablet (800 mg total) by mouth every 8 (eight) hours as needed for moderate pain. Patient not taking: Reported on 06/08/2016 11/27/15   Irean Hong, MD  lidocaine (XYLOCAINE) 2 % solution Use as directed 20 mLs in the mouth or throat as needed for mouth pain. Patient  not taking: Reported on 06/08/2016 11/23/14   Beers, Charmayne Sheer, PA-C  methylPREDNISolone (MEDROL DOSEPAK) 4 MG TBPK tablet Take Tapered dose as directed Patient not taking: Reported on 06/08/2016 02/03/15   Joni Reining, PA-C  metroNIDAZOLE (FLAGYL) 500 MG tablet Take 1 tablet (500 mg total) by mouth 2 (two) times daily. 06/08/16   Jeanmarie Plant, MD  naproxen (NAPROSYN) 500 MG tablet Take 1 tablet (500 mg total)  by mouth 2 (two) times daily with a meal. Patient not taking: Reported on 06/08/2016 07/17/15   Hagler, Ernestene Kiel, PA-C  Prenatal Vit-Fe Fumarate-FA (PRENATAL MULTIVITAMIN) TABS tablet Take 1 tablet by mouth daily at 12 noon.    [provider]  sulfamethoxazole-trimethoprim (BACTRIM DS,SEPTRA DS) 800-160 MG tablet Take 1 tablet by mouth 2 (two) times daily. Patient not taking: Reported on 06/08/2016 08/29/15   Joni Reining, PA-C    Allergies Zofran Frazier Richards hcl] and Latex  History reviewed. No pertinent family history.  Social History Social History  Substance Use Topics  . Smoking status: Never Smoker  . Smokeless tobacco: Never Used  . Alcohol use No    Review of Systems Constitutional: No fever/chills Eyes: No visual changes. ENT: No sore throat. Cardiovascular: Denies chest pain. Respiratory: Denies shortness of breath. Gastrointestinal: nausea. Vomited up once "acid".  No diarrhea.  No constipation. Genitourinary: Negative for dysuria. Musculoskeletal: Negative for back pain. Skin: Negative for rash. Neurological: Negative for headaches, focal weakness or numbness.    ____________________________________________   PHYSICAL EXAM:  VITAL SIGNS: ED Triage Vitals  Enc Vitals Group     BP 02/07/17 1834 105/61     Pulse Rate 02/07/17 1834 87     Resp 02/07/17 1834 20     Temp 02/07/17 1834 98.6 F (37 C)     Temp Source 02/07/17 1834 Oral     SpO2 02/07/17 1834 100 %     Weight 02/07/17 1838 190 lb (86.2 kg)     Height 02/07/17 1838 5' (1.524 m)     Head Circumference --      Peak Flow --      Pain Score 02/07/17 1834 9     Pain Loc --      Pain Edu? --      Excl. in GC? --     Constitutional: Alert and oriented. Well appearing and in no acute distress. Eyes: Conjunctivae are normal. Head: Atraumatic. Nose: No congestion/rhinnorhea. Mouth/Throat: Mucous membranes are moist. Neck: No stridor.   Cardiovascular: Normal rate, regular rhythm.  Grossly normal heart sounds.  Good peripheral circulation. Respiratory: Normal respiratory effort.  No retractions. Lungs CTAB. Gastrointestinal: Soft and mildly tender throughout, but reports severe and focal tenderness in the right upper quadrant and epigastrium. No rigidity. No distention. Musculoskeletal: No lower extremity tenderness nor edema. Neurologic:  Normal speech and language. No gross focal neurologic deficits are appreciated.  Skin:  Skin is warm, dry and intact. No rash noted. Psychiatric: Mood and affect are normal. Speech and behavior are normal.  ____________________________________________   LABS (all labs ordered are listed, but only abnormal results are displayed)  Labs Reviewed  COMPREHENSIVE METABOLIC PANEL - Abnormal; Notable for the following:       Result Value   Creatinine, Ser 1.17 (*)    Calcium 8.7 (*)    Albumin 3.2 (*)    AST 69 (*)    All other components within normal limits  CBC - Abnormal; Notable for the following:    RBC 3.72 (*)  Hemoglobin 9.9 (*)    HCT 29.2 (*)    MCV 78.6 (*)    RDW 15.9 (*)    All other components within normal limits  LIPASE, BLOOD  URINALYSIS, COMPLETE (UACMP) WITH MICROSCOPIC   ____________________________________________  EKG   ____________________________________________  RADIOLOGY  right upper quadrant ultrasound pending ____________________________________________   PROCEDURES  Procedure(s) performed: None  Procedures  Critical Care performed: No  ____________________________________________   INITIAL IMPRESSION / ASSESSMENT AND PLAN / ED COURSE  Pertinent labs & imaging results that were available during my care of the patient were reviewed by me and considered in my medical decision making (see chart for details).  Differential diagnosis includes but is not limited to, abdominal perforation, aortic dissection, cholecystitis, appendicitis, diverticulitis, colitis, esophagitis/gastritis,  kidney stone, pyelonephritis, urinary tract infection, and OBGYN source considered. she is delivered less than 2 weeks ago, not pregnant. All are considered in decision and treatment plan. Based upon the patient's presentation and risk factors, she is also noting primarily right upper quadrant discomfort radiating to the back at this time proceed with ultrasound of the right upper quadrant. She has no fever, normal white count, did not believe this would represent retained product at this time would certainly continue to consider this and her differential the right upper quadrant ultrasound is unrevealing however she denies any concerning gynecologic symptoms beyond improving lochia.   Clinical Course as of Feb 08 2135  Sat Feb 07, 2017  2112 updated patient on delivery for ultrasound. She reports at this time that her nausea is resolving, and the pain she is having is now seeding itself in her right upper quadrant with some radiation to the back. She would like to await for morphine or pain medicine until after the ultrasound at this point is agreeable with this plan.  Ongoing care assigned to Dr. Roxan Hockey. Patient is about 2 weeks post normal spontaneous vaginal delivery presenting with pain, abdominal mostly in the right upper quadrant area we will obtain right upper quadrant ultrasound, this is normal or does not show an etiology I would advise CT abdomen and pelvis to further evaluate.  [MQ]    Clinical Course User Index [MQ] Sharyn Creamer, MD     ____________________________________________   FINAL CLINICAL IMPRESSION(S) / ED DIAGNOSES  Final diagnoses:  Abdominal pain      NEW MEDICATIONS STARTED DURING THIS VISIT:  New Prescriptions   No medications on file     Note:  This document was prepared using Dragon voice recognition software and may include unintentional dictation errors.     Sharyn Creamer, MD 02/07/17 2136

## 2017-02-07 NOTE — ED Triage Notes (Signed)
Per EMS pt comes from home with 9/10 abd pain.  Pt gave birth one week ago.  Pt vomited once today.  Pt b/p 105/61.  Pt A&Ox4 and states having normal spotting but not heaving bleeding or passing clots.

## 2017-02-07 NOTE — ED Notes (Signed)
PA student at bedside.

## 2017-02-08 LAB — URINALYSIS, COMPLETE (UACMP) WITH MICROSCOPIC
Bilirubin Urine: NEGATIVE
GLUCOSE, UA: NEGATIVE mg/dL
Ketones, ur: NEGATIVE mg/dL
NITRITE: NEGATIVE
Protein, ur: 100 mg/dL — AB
SPECIFIC GRAVITY, URINE: 1.033 — AB (ref 1.005–1.030)
pH: 6 (ref 5.0–8.0)

## 2017-02-08 MED ORDER — MORPHINE SULFATE (PF) 4 MG/ML IV SOLN: 4.0000 mg | INTRAVENOUS | Status: DC | PRN

## 2017-02-08 MED ORDER — CEPHALEXIN 500 MG PO CAPS: 500.0000 mg | ORAL_CAPSULE | Freq: Three times a day (TID) | ORAL | 0 refills | Status: AC

## 2017-02-08 MED ORDER — OXYCODONE-ACETAMINOPHEN 5-325 MG PO TABS: 1.0000 | ORAL_TABLET | Freq: Four times a day (QID) | ORAL | 0 refills | Status: DC | PRN

## 2017-02-08 MED ORDER — KETOROLAC TROMETHAMINE 30 MG/ML IJ SOLN
15.0000 mg | Freq: Once | INTRAMUSCULAR | Status: AC
  Administered 2017-02-08: 15 mg via INTRAVENOUS
  Filled 2017-02-08: qty 1

## 2017-02-08 MED ORDER — METOCLOPRAMIDE HCL 5 MG/ML IJ SOLN
10.0000 mg | Freq: Once | INTRAMUSCULAR | Status: AC
  Administered 2017-02-08: 10 mg via INTRAVENOUS
  Filled 2017-02-08: qty 2

## 2017-02-08 MED ORDER — CEPHALEXIN 500 MG PO CAPS
500.0000 mg | ORAL_CAPSULE | Freq: Once | ORAL | Status: AC
  Administered 2017-02-08: 500 mg via ORAL
  Filled 2017-02-08: qty 1

## 2017-02-08 NOTE — ED Provider Notes (Signed)
Patient received in sign-out from Dr. Fanny Bien.  Workup and evaluation pending [place] and UA.  Ultrasound shows evidence of cholelithiasis without evidence of cholecystitis. Patient has no fever and is afebrile. Does have some mild tenderness but not peritoneal. I do feel that she simply having symptomatic cholelithiasis at this time. Will plan for symptomatic management. If tolerating oral hydration and pain isn't controlled or do feel that she'll be appropriate for follow-up with surgery as an outpatient.      Willy Eddy, MD 02/08/17 [phone]

## 2017-02-08 NOTE — ED Notes (Signed)
PO challenge att

## 2017-02-08 NOTE — ED Provider Notes (Signed)
-----------------------------------------   2:57 AM on 02/08/2017 -----------------------------------------   Blood pressure 102/62, pulse 81, temperature 98.6 F (37 C), temperature source Oral, resp. rate 16, height 5' (1.524 m), weight 86.2 kg (190 lb), last menstrual period 05/22/2016, SpO2 99 %, unknown if currently breastfeeding.  Assuming care from Dr. Roxan Hockey.  In short, Candice Lewis is a 21 y.o. female with a chief complaint of Abdominal Pain .  Refer to the original H&P for additional details.  The current plan of care is to follow up the results of the Urinalysis.   Clinical Course as of Feb 09 256  Sat Feb 07, 2017  2112 updated patient on delivery for ultrasound. She reports at this time that her nausea is resolving, and the pain she is having is now seeding itself in her right upper quadrant with some radiation to the back. She would like to await for morphine or pain medicine until after the ultrasound at this point is agreeable with this plan.  Ongoing care assigned to Dr. Roxan Hockey. Patient is about 2 weeks post normal spontaneous vaginal delivery presenting with pain, abdominal mostly in the right upper quadrant area we will obtain right upper quadrant ultrasound, this is normal or does not show an etiology I would advise CT abdomen and pelvis to further evaluate.  [MQ]    Clinical Course User Index [MQ] Sharyn Creamer, MD   The patient appears to have too numerous to count the blood cells and white blood cells in her urine. Although she has rare bacteria I will still treat the patient with Keflex for a urinary tract infection. She will also receive some pain medication for her cholelithiasis and follow-up with surgery. the patient should follow-up   Rebecka Apley, MD 02/08/17 [phone]

## 2017-02-08 NOTE — Discharge Instructions (Signed)
Please follow up with surgery to remove your gall bladder electively.

## 2017-02-08 NOTE — ED Notes (Signed)
Pt returned from US

## 2017-02-08 NOTE — ED Notes (Signed)
Patient transported to Ultrasound 

## 2017-02-08 NOTE — ED Notes (Signed)
Pt. Going home with family. 

## 2017-02-09 LAB — URINE CULTURE

## 2017-02-25 ENCOUNTER — Ambulatory Visit: Payer: Self-pay | Admitting: General Surgery

## 2017-02-28 ENCOUNTER — Emergency Department
Admission: EM | Admit: 2017-02-28 | Discharge: 2017-02-28 | Disposition: A | Discharge status: Home / Self Care | Payer: Medicaid Other | Attending: Emergency Medicine | Admitting: Emergency Medicine

## 2017-02-28 ENCOUNTER — Emergency Department: Payer: Medicaid Other

## 2017-02-28 ENCOUNTER — Encounter: Payer: Self-pay | Admitting: Emergency Medicine

## 2017-02-28 DIAGNOSIS — J45909 Unspecified asthma, uncomplicated: Secondary | ICD-10-CM | POA: Diagnosis not present

## 2017-02-28 DIAGNOSIS — N390 Urinary tract infection, site not specified: Secondary | ICD-10-CM | POA: Diagnosis not present

## 2017-02-28 DIAGNOSIS — Z91040 Latex allergy status: Secondary | ICD-10-CM | POA: Diagnosis not present

## 2017-02-28 DIAGNOSIS — K8050 Calculus of bile duct without cholangitis or cholecystitis without obstruction: Secondary | ICD-10-CM | POA: Insufficient documentation

## 2017-02-28 DIAGNOSIS — R109 Unspecified abdominal pain: Secondary | ICD-10-CM | POA: Diagnosis present

## 2017-02-28 HISTORY — DX: Calculus of gallbladder without cholecystitis without obstruction: K80.20

## 2017-02-28 LAB — URINALYSIS, COMPLETE (UACMP) WITH MICROSCOPIC
BILIRUBIN URINE: NEGATIVE
Glucose, UA: NEGATIVE mg/dL
KETONES UR: NEGATIVE mg/dL
Nitrite: NEGATIVE
Protein, ur: NEGATIVE mg/dL
Specific Gravity, Urine: 1.01 (ref 1.005–1.030)
pH: 6 (ref 5.0–8.0)

## 2017-02-28 LAB — CBC
HEMATOCRIT: 32.8 % — AB (ref 35.0–47.0)
Hemoglobin: 10.9 g/dL — ABNORMAL LOW (ref 12.0–16.0)
MCH: 24.9 pg — AB (ref 26.0–34.0)
MCHC: 33.2 g/dL (ref 32.0–36.0)
MCV: 75.2 fL — AB (ref 80.0–100.0)
Platelets: 257 10*3/uL (ref 150–440)
RBC: 4.37 MIL/uL (ref 3.80–5.20)
RDW: 17 % — AB (ref 11.5–14.5)
WBC: 8.4 10*3/uL (ref 3.6–11.0)

## 2017-02-28 LAB — COMPREHENSIVE METABOLIC PANEL
ALBUMIN: 3.6 g/dL (ref 3.5–5.0)
ALK PHOS: 129 U/L — AB (ref 38–126)
ALT: 40 U/L (ref 14–54)
AST: 71 U/L — ABNORMAL HIGH (ref 15–41)
Anion gap: 8 (ref 5–15)
BUN: 17 mg/dL (ref 6–20)
CALCIUM: 8.9 mg/dL (ref 8.9–10.3)
CHLORIDE: 107 mmol/L (ref 101–111)
CO2: 23 mmol/L (ref 22–32)
CREATININE: 1.07 mg/dL — AB (ref 0.44–1.00)
GFR calc non Af Amer: >60 mL/min (ref >60)
GLUCOSE: 90 mg/dL (ref 65–99)
Potassium: 3.9 mmol/L (ref 3.5–5.1)
SODIUM: 138 mmol/L (ref 135–145)
Total Bilirubin: 0.6 mg/dL (ref 0.3–1.2)
Total Protein: 7.2 g/dL (ref 6.5–8.1)

## 2017-02-28 LAB — LIPASE, BLOOD: LIPASE: 37 U/L (ref 11–51)

## 2017-02-28 MED ORDER — PROMETHAZINE HCL 25 MG PO TABS
25.0000 mg | ORAL_TABLET | Freq: Once | ORAL | Status: AC
  Administered 2017-02-28: 25 mg via ORAL
  Filled 2017-02-28: qty 1

## 2017-02-28 MED ORDER — CEPHALEXIN 500 MG PO CAPS
500.0000 mg | ORAL_CAPSULE | Freq: Once | ORAL | Status: AC
  Administered 2017-02-28: 500 mg via ORAL
  Filled 2017-02-28: qty 1

## 2017-02-28 MED ORDER — CEPHALEXIN 500 MG PO CAPS: 500.0000 mg | ORAL_CAPSULE | Freq: Two times a day (BID) | ORAL | 0 refills | Status: DC

## 2017-02-28 MED ORDER — TRAMADOL HCL 50 MG PO TABS
100.0000 mg | ORAL_TABLET | Freq: Once | ORAL | Status: AC
  Administered 2017-02-28: 100 mg via ORAL
  Filled 2017-02-28: qty 2

## 2017-02-28 MED ORDER — PROMETHAZINE HCL 12.5 MG PO TABS: 12.5000 mg | ORAL_TABLET | Freq: Four times a day (QID) | ORAL | 0 refills | Status: DC | PRN

## 2017-02-28 MED ORDER — TRAMADOL HCL 50 MG PO TABS: ORAL_TABLET | ORAL | 0 refills | Status: DC

## 2017-02-28 NOTE — Discharge Instructions (Signed)
You have been seen in the Emergency Department (ED) for abdominal pain.  Your evaluation suggests that your pain is caused by gallstones.  Fortunately you do not need immediate surgery at this time, but it is important that you follow up with a surgeon as an outpatient; typically surgical removal of the gallbladder is the only thing that will definitively fix your issue.  Read through the included information about a bland diet, and use any prescribed medications as instructed.  Avoid smoking and alcohol use.  You also appear to have a urinary tract infection.  Please take the full course of prescribed antibiotics (Keflex/cephalexin).  Follow up with your regular doctor at the next available opportuntiy.  Please follow up as instructed above regarding today?s emergent visit and the symptoms that are bothering you.  Take Tramadol as prescribed. Do not drink alcohol, drive or participate in any other potentially dangerous activities while taking this medication as it may make you sleepy. Do not take this medication with any other sedating medications, either prescription or over-the-counter. If you were prescribed Percocet or Vicodin, do not take these with acetaminophen (Tylenol) as it is already contained within these medications.   This medication is an opiate (or narcotic) pain medication and can be habit forming.  Use it as little as possible to achieve adequate pain control.  Do not use or use it with extreme caution if you have a history of opiate abuse or dependence.  If you are on a pain contract with your primary care doctor or a pain specialist, be sure to let them know you were prescribed this medication today from the [facility].  This medication is intended for your use only - do not give any to anyone else and keep it in a secure place where nobody else, especially children, have access to it.  It will also cause or worsen constipation, so you may want to consider  taking an over-the-counter stool softener while you are taking this medication.  Return to the ED if your abdominal pain worsens or fails to improve, you develop bloody vomiting, bloody diarrhea, you are unable to tolerate fluids due to vomiting, fever greater than 101, or other symptoms that concern you.

## 2017-02-28 NOTE — ED Provider Notes (Signed)
[facility] Emergency Department Provider Note  ____________________________________________   First MD Initiated Contact with Patient 02/28/17 0601     (approximate)  I have reviewed the triage vital signs and the nursing notes.   HISTORY  Chief Complaint Abdominal Pain    HPI Candice Lewis is a 21 y.o. female who presents by private vehicle for evaluation of abdominal pain.  She was seen in the emergency department about 3 weeks ago and diagnosed with biliary colic/gallstones.  She reports that she did not follow-up with a surgeon.  She states that over the last 3 weeks she has intermittently had some pain that seems to be worse after eating but it is not constant.  She reports that tonight she did not have any pain after eating dinner but she woke in the middle of night with severe right upper quadrant sharp and aching abdominal pain associated with nausea and at least one episode of vomiting.  Nothing in particular makes the patient's symptoms better nor worse.  She denies fever/chills, chest pain, shortness of breath, lower abdominal pain, dysuria.   Past Medical History:  Diagnosis Date  . Asthma   . Gallstones   . Migraine   . Migraine     Patient Active Problem List   Diagnosis Date Noted  . Pregnancy 12/17/2016    History reviewed. No pertinent surgical history.  Prior to Admission medications   Medication Sig Start Date End Date Taking? Authorizing Provider  cephALEXin (KEFLEX) 500 MG capsule Take 1 capsule (500 mg total) by mouth 2 (two) times daily. 02/28/17   Loleta Rose, MD  promethazine (PHENERGAN) 12.5 MG tablet Take 1 tablet (12.5 mg total) by mouth every 6 (six) hours as needed for nausea or vomiting. 02/28/17   Loleta Rose, MD  traMADol Janean Sark) 50 MG tablet Take 1-2 tablets by mouth every 6 hours as needed for moderate to severe pain 02/28/17   Loleta Rose, MD    Allergies Zofran Frazier Richards hcl] and Latex  History  reviewed. No pertinent family history.  Social History Social History  Substance Use Topics  . Smoking status: Never Smoker  . Smokeless tobacco: Never Used  . Alcohol use No    Review of Systems Constitutional: No fever/chills Cardiovascular: Denies chest pain. Respiratory: Denies shortness of breath. Gastrointestinal: RUQ abdominal pain.  Nausea, 1 episode vomiting Genitourinary: Negative for dysuria. Musculoskeletal: Negative for neck pain.  Negative for back pain. Integumentary: Negative for rash. Neurological: Negative for headaches, focal weakness or numbness.   ____________________________________________   PHYSICAL EXAM:  VITAL SIGNS: ED Triage Vitals  Enc Vitals Group     BP 02/28/17 0358 116/67     Pulse Rate 02/28/17 0358 88     Resp 02/28/17 0358 18     Temp 02/28/17 0358 98.5 F (36.9 C)     Temp Source 02/28/17 0358 Oral     SpO2 02/28/17 0358 98 %     Weight 02/28/17 0359 86.2 kg (190 lb)     Height 02/28/17 0359 1.524 m (5')     Head Circumference --      Peak Flow --      Pain Score 02/28/17 0357 9     Pain Loc --      Pain Edu? --      Excl. in GC? --     Constitutional: Alert and oriented. Well appearing and in no acute distress but does appear uncomfortable Eyes: Conjunctivae are normal.  Head: Atraumatic. Nose: No  congestion/rhinnorhea. Mouth/Throat: Mucous membranes are moist. Neck: No stridor.  No meningeal signs.   Cardiovascular: Normal rate, regular rhythm. Good peripheral circulation. Grossly normal heart sounds. Respiratory: Normal respiratory effort.  No retractions. Lungs CTAB. Gastrointestinal: Soft w/ TTP of the RUQ with +Murphy's sign.  No rebound, guarding, nor lower abdominal tenderness Musculoskeletal: No lower extremity tenderness nor edema. No gross deformities of extremities. Neurologic:  Normal speech and language. No gross focal neurologic deficits are appreciated.  Skin:  Skin is warm, dry and intact. No rash  noted. Psychiatric: Mood and affect are normal. Speech and behavior are normal.  ____________________________________________   LABS (all labs ordered are listed, but only abnormal results are displayed)  Labs Reviewed  COMPREHENSIVE METABOLIC PANEL - Abnormal; Notable for the following:       Result Value   Creatinine, Ser 1.07 (*)    AST 71 (*)    Alkaline Phosphatase 129 (*)    All other components within normal limits  CBC - Abnormal; Notable for the following:    Hemoglobin 10.9 (*)    HCT 32.8 (*)    MCV 75.2 (*)    MCH 24.9 (*)    RDW 17.0 (*)    All other components within normal limits  URINALYSIS, COMPLETE (UACMP) WITH MICROSCOPIC - Abnormal; Notable for the following:    Color, Urine YELLOW (*)    APPearance HAZY (*)    Hgb urine dipstick MODERATE (*)    Leukocytes, UA MODERATE (*)    Bacteria, UA RARE (*)    Squamous Epithelial / LPF 6-30 (*)    All other components within normal limits  URINE CULTURE  LIPASE, BLOOD   ____________________________________________  EKG  None - EKG not ordered by ED physician ____________________________________________  RADIOLOGY   US Abdomen Limited Ruq  Result Date: 02/28/2017 CLINICAL DATA:  Worsening right upper quadrant pain. Known gallstones. EXAM: ULTRASOUND ABDOMEN LIMITED RIGHT UPPER QUADRANT COMPARISON:  02/07/2017 FINDINGS: Gallbladder: Mild gallbladder distention. Multiple small stones and sludge layering in the gallbladder. Largest stone measures about 4 mm. No gallbladder wall thickening or pericholecystic edema. Murphy's sign is negative. Common bile duct: Diameter: 6 mm, upper limits of normal Liver: No focal lesion identified. Within normal limits in parenchymal echogenicity. Portal vein is patent on color Doppler imaging with normal direction of blood flow towards the liver. IMPRESSION: Cholelithiasis. Murphy's sign is negative. No additional changes to suggest cholecystitis. Electronically Signed   By: Burman Nieves M.D.   On: 02/28/2017 05:58    ____________________________________________   PROCEDURES  Critical Care performed: No   Procedure(s) performed:   Procedures   ____________________________________________   INITIAL IMPRESSION / ASSESSMENT AND PLAN / ED COURSE  As part of my medical decision making, I reviewed the following data within the electronic MEDICAL RECORD NUMBER Labs reviewed [facility] Course for details)  and Notes from prior ED visits    A repeat ultrasound shows no significant interval change from her last ultrasound.  There is no evidence of cholecystitis at this time.  She once again has too numerous to count white blood cells in her urine like she did last month.  I have sent off a urine culture; her last culture demonstrated multiple types of bacteria and recommended re-collection, but she seems to be asymptomatic from this issue.  Labs are notable for a very slightly elevated alkaline phosphatase and a slightly elevated AST at 71 but a normal ALT.  Given that she continues to have symptomatic cholelithiasis but  no evidence for emergent surgery at this time, I will try a treating her pain with 100 mg of tramadol and her nausea with a tablet of Phenergan by mouth.  I am also giving her a dose of Keflex by mouth for her urinary tract infection.  If she is able to tolerate by mouth intake and her pain is adequately controlled she can follow-up as an outpatient.  Otherwise we will need to consult surgery for evaluation of symptomatic cholelithiasis.  She understands and agrees With the plan.  Clinical Course as of Feb 29 752  Sat Feb 28, 2017  1610 The patient tolerated the by mouth challenge and states she feels like she can go home.  She still has some residual nausea.  I prescribed tramadol, Phenergan for nausea control, and Keflex for her urinary tract infection.  I gave my usual and customary return precautions.     [CF]    Clinical Course User Index [CF]  Loleta Rose, MD    ____________________________________________  FINAL CLINICAL IMPRESSION(S) / ED DIAGNOSES  Final diagnoses:  Biliary colic  Urinary tract infection without hematuria, site unspecified     MEDICATIONS GIVEN DURING THIS VISIT:  Medications  traMADol (ULTRAM) tablet 100 mg (100 mg Oral Given 02/28/17 0631)  promethazine (PHENERGAN) tablet 25 mg (25 mg Oral Given 02/28/17 0631)  cephALEXin (KEFLEX) capsule 500 mg (500 mg Oral Given 02/28/17 0631)     NEW OUTPATIENT MEDICATIONS STARTED DURING THIS VISIT:  New Prescriptions   CEPHALEXIN (KEFLEX) 500 MG CAPSULE    Take 1 capsule (500 mg total) by mouth 2 (two) times daily.   PROMETHAZINE (PHENERGAN) 12.5 MG TABLET    Take 1 tablet (12.5 mg total) by mouth every 6 (six) hours as needed for nausea or vomiting.   TRAMADOL (ULTRAM) 50 MG TABLET    Take 1-2 tablets by mouth every 6 hours as needed for moderate to severe pain    Modified Medications   No medications on file    Discontinued Medications   ACETAMINOPHEN-CODEINE (TYLENOL #3) 300-30 MG TABLET    Take 1-2 tablets by mouth every 4 (four) hours as needed for moderate pain.   AMOXICILLIN (AMOXIL) 500 MG TABLET    Take 1 tablet (500 mg total) by mouth 2 (two) times daily.   BUTALBITAL-ACETAMINOPHEN-CAFFEINE (FIORICET) 50-325-40 MG PER TABLET    Take 1-2 tablets by mouth every 6 (six) hours as needed for headache.   DOXYLAMINE-PYRIDOXINE (DICLEGIS) 10-10 MG TBEC    2 tabs orally at hs (Day 1). If this works, continue taking 2 tab qhs. However, if nausea persists Day 2, take 2 tabs at bedtime that night then take three tablets starting on Day 3 (one tablet in the morning and two tablets at bedtime). If these 3 tablets control symptoms on Day 4, continue taking 3 tabs daily. Otherwise take 4 tabs starting on Day 4 (one tablet in the morning, one tablet mid-afternoon and two tablets at bedtime).   HYDROCODONE-ACETAMINOPHEN (NORCO) 5-325 MG TABLET    Take 1 tablet by  mouth every 6 (six) hours as needed for moderate pain.   HYDROXYZINE (ATARAX/VISTARIL) 50 MG TABLET    Take 1 tablet (50 mg total) by mouth 3 (three) times daily as needed for itching.   HYDROXYZINE (ATARAX/VISTARIL) 50 MG TABLET    Take 1 tablet (50 mg total) by mouth 3 (three) times daily as needed for itching.   IBUPROFEN (ADVIL,MOTRIN) 800 MG TABLET    Take 1 tablet (800 mg  total) by mouth every 8 (eight) hours as needed for moderate pain.   LIDOCAINE (XYLOCAINE) 2 % SOLUTION    Use as directed 20 mLs in the mouth or throat as needed for mouth pain.   METHYLPREDNISOLONE (MEDROL DOSEPAK) 4 MG TBPK TABLET    Take Tapered dose as directed   METRONIDAZOLE (FLAGYL) 500 MG TABLET    Take 1 tablet (500 mg total) by mouth 2 (two) times daily.   NAPROXEN (NAPROSYN) 500 MG TABLET    Take 1 tablet (500 mg total) by mouth 2 (two) times daily with a meal.   OXYCODONE-ACETAMINOPHEN (ROXICET) 5-325 MG TABLET    Take 1 tablet by mouth every 6 (six) hours as needed.   PRENATAL VIT-FE FUMARATE-FA (PRENATAL MULTIVITAMIN) TABS TABLET    Take 1 tablet by mouth daily at 12 noon.   SULFAMETHOXAZOLE-TRIMETHOPRIM (BACTRIM DS,SEPTRA DS) 800-160 MG TABLET    Take 1 tablet by mouth 2 (two) times daily.     Note:  This document was prepared using Dragon voice recognition software and may include unintentional dictation errors.    Loleta Rose, MD 02/28/17 [phone]

## 2017-02-28 NOTE — ED Notes (Signed)
Pt given food and drink. Reports some nausea with eating.

## 2017-02-28 NOTE — ED Notes (Signed)
Pt unable to void at this time. 

## 2017-02-28 NOTE — ED Triage Notes (Signed)
Pt presents with c/o right upper quadrant pain x 1 hour; N/V, no diarrhea; pt seen here 02/07/17 and diagnosed with gallstones; pt did not follow up as directed because her son had a doctor's appointment at the same time;

## 2017-03-01 LAB — URINE CULTURE
Culture: 50000 — AB
SPECIAL REQUESTS: NORMAL

## 2017-03-06 ENCOUNTER — Other Ambulatory Visit: Payer: Self-pay

## 2017-03-09 ENCOUNTER — Encounter: Payer: Self-pay | Admitting: General Surgery

## 2017-03-09 ENCOUNTER — Other Ambulatory Visit
Admission: RE | Admit: 2017-03-09 | Discharge: 2017-03-09 | Disposition: A | Discharge status: Home / Self Care | Payer: Medicaid Other | Source: Ambulatory Visit | Attending: General Surgery | Admitting: General Surgery

## 2017-03-09 ENCOUNTER — Ambulatory Visit (INDEPENDENT_AMBULATORY_CARE_PROVIDER_SITE_OTHER): Payer: Medicaid Other | Admitting: General Surgery

## 2017-03-09 VITALS — BP 124/83 | HR 79 | Temp 98.8°F | Ht 60.0 in | Wt 194.0 lb

## 2017-03-09 DIAGNOSIS — M549 Dorsalgia, unspecified: Secondary | ICD-10-CM | POA: Diagnosis not present

## 2017-03-09 DIAGNOSIS — R1011 Right upper quadrant pain: Secondary | ICD-10-CM

## 2017-03-09 LAB — URINALYSIS, ROUTINE W REFLEX MICROSCOPIC
Bilirubin Urine: NEGATIVE
GLUCOSE, UA: NEGATIVE mg/dL
Hgb urine dipstick: NEGATIVE
Ketones, ur: NEGATIVE mg/dL
Nitrite: NEGATIVE
PH: 7 (ref 5.0–8.0)
PROTEIN: NEGATIVE mg/dL
Specific Gravity, Urine: 1.009 (ref 1.005–1.030)

## 2017-03-09 MED ORDER — PROMETHAZINE HCL 12.5 MG PO TABS: 12.5000 mg | ORAL_TABLET | Freq: Four times a day (QID) | ORAL | 0 refills | Status: DC | PRN

## 2017-03-09 NOTE — Patient Instructions (Signed)
Bland Diet A bland diet consists of foods that do not have a lot of fat or fiber. Foods without fat or fiber are easier for the body to digest. They are also less likely to irritate your mouth, throat, stomach, and other parts of your gastrointestinal tract. A bland diet is sometimes called a BRAT diet. What is my plan? Your health care provider or dietitian may recommend specific changes to your diet to prevent and treat your symptoms, such as:  Eating small meals often.  Cooking food until it is soft enough to chew easily.  Chewing your food well.  Drinking fluids slowly.  Not eating foods that are very spicy, sour, or fatty.  Not eating citrus fruits, such as oranges and grapefruit.  What do I need to know about this diet?  Eat a variety of foods from the bland diet food list.  Do not follow a bland diet longer than you have to.  Ask your health care provider whether you should take vitamins. What foods can I eat? Grains  Hot cereals, such as cream of wheat. Bread, crackers, or tortillas made from refined white flour. Rice. Vegetables Canned or cooked vegetables. Mashed or boiled potatoes. Fruits Bananas. Applesauce. Other types of cooked or canned fruit with the skin and seeds removed, such as canned peaches or pears. Meats and Other Protein Sources Scrambled eggs. Creamy peanut butter or other nut butters. Lean, well-cooked meats, such as chicken or fish. Tofu. Soups or broths. Dairy Low-fat dairy products, such as milk, cottage cheese, or yogurt. Beverages Water. Herbal tea. Apple juice. Sweets and Desserts Pudding. Custard. Fruit gelatin. Ice cream. Fats and Oils Mild salad dressings. Canola or olive oil. The items listed above may not be a complete list of allowed foods or beverages. Contact your dietitian for more options. What foods are not recommended? Foods and ingredients that are often not recommended include:  Spicy foods, such as hot sauce or  salsa.  Fried foods.  Sour foods, such as pickled or fermented foods.  Raw vegetables or fruits, especially citrus or berries.  Caffeinated drinks.  Alcohol.  Strongly flavored seasonings or condiments.  The items listed above may not be a complete list of foods and beverages that are not allowed. Contact your dietitian for more information. This information is not intended to replace advice given to you by your health care provider. Make sure you discuss any questions you have with your health care provider. Document Released: 09/03/2015 Document Revised: 10/18/2015 Document Reviewed: 05/24/2014 Elsevier Interactive Patient Education  2018 [employer].   You have requested to have your gallbladder removed. This will be done on 03/24/2017 at [facility] with Dr. Tonita Cong.  You will most likely be out of work 1-2 weeks for this surgery. You will return after your post-op appointment with a lifting restriction for approximately 4 more weeks.  You will be able to eat anything you would like to following surgery. But, start by eating a bland diet and advance this as tolerated. The Gallbladder diet is below, please go as closely by this diet as possible prior to surgery to avoid any further attacks.  Please see the (blue)pre-care form that you have been given today. If you have any questions, please call our office.  Laparoscopic Cholecystectomy Laparoscopic cholecystectomy is surgery to remove the gallbladder. The gallbladder is located in the upper right part of the abdomen, behind the liver. It is a storage sac for bile, which is produced in the liver. Bile aids  in the digestion and absorption of fats. Cholecystectomy is often done for inflammation of the gallbladder (cholecystitis). This condition is usually caused by a buildup of gallstones (cholelithiasis) in the gallbladder. Gallstones can block the flow of bile, and that can result in inflammation and pain. In severe cases,  emergency surgery may be required. If emergency surgery is not required, you will have time to prepare for the procedure. Laparoscopic surgery is an alternative to open surgery. Laparoscopic surgery has a shorter recovery time. Your common bile duct may also need to be examined during the procedure. If stones are found in the common bile duct, they may be removed. LET [facility] CARE PROVIDER KNOW ABOUT:  Any allergies you have.  All medicines you are taking, including vitamins, herbs, eye drops, creams, and over-the-counter medicines.  Previous problems you or members of your family have had with the use of anesthetics.  Any blood disorders you have.  Previous surgeries you have had.    Any medical conditions you have. RISKS AND COMPLICATIONS Generally, this is a safe procedure. However, problems may occur, including:  Infection.  Bleeding.  Allergic reactions to medicines.  Damage to other structures or organs.  A stone remaining in the common bile duct.  A bile leak from the cyst duct that is clipped when your gallbladder is removed.  The need to convert to open surgery, which requires a larger incision in the abdomen. This may be necessary if your surgeon thinks that it is not safe to continue with a laparoscopic procedure. BEFORE THE PROCEDURE  Ask your health care provider about:  Changing or stopping your regular medicines. This is especially important if you are taking diabetes medicines or blood thinners.  Taking medicines such as aspirin and ibuprofen. These medicines can thin your blood. Do not take these medicines before your procedure if your health care provider instructs you not to.  Follow instructions from your health care provider about eating or drinking restrictions.  Let your health care provider know if you develop a cold or an infection before surgery.  Plan to have someone take you home after the procedure.  Ask your health care provider how  your surgical site will be marked or identified.  You may be given antibiotic medicine to help prevent infection. PROCEDURE  To reduce your risk of infection:  Your health care team will wash or sanitize their hands.  Your skin will be washed with soap.  An IV tube may be inserted into one of your veins.  You will be given a medicine to make you fall asleep (general anesthetic).  A breathing tube will be placed in your mouth.  The surgeon will make several small cuts (incisions) in your abdomen.  A thin, lighted tube (laparoscope) that has a tiny camera on the end will be inserted through one of the small incisions. The camera on the laparoscope will send a picture to a TV screen (monitor) in the operating room. This will give the surgeon a good view inside your abdomen.  A gas will be pumped into your abdomen. This will expand your abdomen to give the surgeon more room to perform the surgery.  Other tools that are needed for the procedure will be inserted through the other incisions. The gallbladder will be removed through one of the incisions.  After your gallbladder has been removed, the incisions will be closed with stitches (sutures), staples, or skin glue.  Your incisions may be covered with a bandage (dressing). The  procedure may vary among health care providers and hospitals. AFTER THE PROCEDURE  Your blood pressure, heart rate, breathing rate, and blood oxygen level will be monitored often until the medicines you were given have worn off.  You will be given medicines as needed to control your pain.   This information is not intended to replace advice given to you by your health care provider. Make sure you discuss any questions you have with your health care provider.   Document Released: 05/12/2005 Document Revised: 01/31/2015 Document Reviewed: 12/22/2012 Elsevier Interactive Patient Education 2016 Elsevier Inc.   Low-Fat Diet for Gallbladder Conditions A low-fat  diet can be helpful if you have pancreatitis or a gallbladder condition. With these conditions, your pancreas and gallbladder have trouble digesting fats. A healthy eating plan with less fat will help rest your pancreas and gallbladder and reduce your symptoms. WHAT DO I NEED TO KNOW ABOUT THIS DIET?  Eat a low-fat diet.  Reduce your fat intake to less than 20-30% of your total daily calories. This is less than 50-60 g of fat per day.  Remember that you need some fat in your diet. Ask your dietician what your daily goal should be.  Choose nonfat and low-fat healthy foods. Look for the words "nonfat," "low fat," or "fat free."  As a guide, look on the label and choose foods with less than 3 g of fat per serving. Eat only one serving.  Avoid alcohol.  Do not smoke. If you need help quitting, talk with your health care provider.  Eat small frequent meals instead of three large heavy meals. WHAT FOODS CAN I EAT? Grains Include healthy grains and starches such as potatoes, wheat bread, fiber-rich cereal, and brown rice. Choose whole grain options whenever possible. In adults, whole grains should account for 45-65% of your daily calories.  Fruits and Vegetables Eat plenty of fruits and vegetables. Fresh fruits and vegetables add fiber to your diet. Meats and Other Protein Sources Eat lean meat such as chicken and pork. Trim any fat off of meat before cooking it. Eggs, fish, and beans are other sources of protein. In adults, these foods should account for 10-35% of your daily calories. Dairy Choose low-fat milk and dairy options. Dairy includes fat and protein, as well as calcium.  Fats and Oils Limit high-fat foods such as fried foods, sweets, baked goods, sugary drinks.  Other Creamy sauces and condiments, such as mayonnaise, can add extra fat. Think about whether or not you need to use them, or use smaller amounts or low fat options. WHAT FOODS ARE NOT RECOMMENDED?  High fat foods, such  as:  [employer].  Ice cream.  [place] toast.  Sweet rolls.  Pizza.  Cheese bread.  Foods covered with batter, butter, creamy sauces, or cheese.  Fried foods.  Sugary drinks and desserts.  Foods that cause gas or bloating   This information is not intended to replace advice given to you by your health care provider. Make sure you discuss any questions you have with your health care provider.   Document Released: 05/17/2013 Document Reviewed: 05/17/2013 Elsevier Interactive Patient Education [employer]

## 2017-03-09 NOTE — Progress Notes (Signed)
Patient ID: Candice Lewis, female   DOB: Nov 22, 1995, 21 y.o.   MRN: [id number]  CC: Right sided pain  HPI Candice Lewis is a 21 y.o. female presents to clinic today for ER follow-up for right-sided abdominal pain. She states she is known she's had gallstones for a while and she's been having intermittent right-sided abdominal pain for the last month. She's had associated nausea and vomiting a few times over the last week. Most recently being this morning. She reports whenever she something fatty, greasy or fast food she develops the nausea and vomiting. She denies any recent fevers, chills, chest pain, shortness of breath, diarrhea, constipation. She's also been having right-sided back pain and has had numerous dirty urinalysis performed over the last 2 weeks. Patient is also 3 months postpartum.  HPI  Past Medical History:  Diagnosis Date  . Asthma   . Gallstones   . Migraine   . Migraine     Past Surgical History:  Procedure Laterality Date  . NO PAST SURGERIES      Family History  Problem Relation Age of Onset  . Gallstones Mother     Social History Social History  Substance Use Topics  . Smoking status: Never Smoker  . Smokeless tobacco: Never Used  . Alcohol use No    Allergies  Allergen Reactions  . Zofran [Ondansetron Hcl] Other (See Comments)    AMS  . Latex Rash    Current Outpatient Prescriptions  Medication Sig Dispense Refill  . promethazine (PHENERGAN) 12.5 MG tablet Take 1 tablet (12.5 mg total) by mouth every 6 (six) hours as needed for nausea or vomiting. 30 tablet 0   No current facility-administered medications for this visit.      Review of Systems A multi-point review of systems was asked and was negative except for the findings documented in the history of present illness  Physical Exam Blood pressure 124/83, pulse 79, temperature 98.8 F (37.1 C), temperature source Oral, height 5' (1.524 m), weight 88 kg (194 lb), last menstrual period  05/22/2016. CONSTITUTIONAL: no acute distress. EYES: Pupils are equal, round, and reactive to light, Sclera are non-icteric. EARS, NOSE, MOUTH AND THROAT: The oropharynx is clear. The oral mucosa is pink and moist. Hearing is intact to voice. LYMPH NODES:  Lymph nodes in the neck are normal. RESPIRATORY:  Lungs are clear. There is normal respiratory effort, with equal breath sounds bilaterally, and without pathologic use of accessory muscles. CARDIOVASCULAR: Heart is regular without murmurs, gallops, or rubs. GI: The abdomen is soft, tender to palpation in the right upper quadrant but with a negative Murphy sign, and nondistended. Patient is also tender along the right costovertebral angle. There are no palpable masses. There is no hepatosplenomegaly. There are normal bowel sounds in all quadrants. GU: Rectal deferred.   MUSCULOSKELETAL: Normal muscle strength and tone. No cyanosis or edema.   SKIN: Turgor is good and there are no pathologic skin lesions or ulcers. NEUROLOGIC: Motor and sensation is grossly normal. Cranial nerves are grossly intact. PSYCH:  Oriented to person, place and time. Affect is normal.  Data Reviewed Images and labs reviewed most recent labs show a normal white blood cell count of 8.4, normal LFTs with the exception of a mildly elevated alkaline phosphatase at 129 and AST at 71. Normal T-Bili, ALT.remainder of her electrolytes are within normal limits. Urinalysis does show moderate hemoglobin and bacteria and white cells present.culture grew a lactobacillus without susceptibility testing.right upper quadrant ultrasound showed evidence of  cholelithiasis without gallbladder wall thickening, pericholecystic fluid, ductal dilatation. I have personally reviewed the patient's imaging, laboratory findings and medical records.    Assessment    Biliary colic and costovertebral angle tenderness    Plan    21 year old female with obvious biliary colic but with confounding  costovertebral angle tenderness. Given her to recent dirty urinalyses that were performed discussed that we would repeat the urinalysis today and treat a suspected urinary tract infection shouldit continue to be dirty. Otherwise we discussed the procedure of a laparoscopic cholecystectomy for the treatment of biliary colic in detail. I discussed the procedure in detail.  The patient was given Agricultural engineer.  We discussed the risks and benefits of a laparoscopic cholecystectomy and possible cholangiogram including, but not limited to bleeding, infection, injury to surrounding structures such as the intestine or liver, bile leak, retained gallstones, need to convert to an open procedure, prolonged diarrhea, blood clots such as  DVT, common bile duct injury, anesthesia risks, and possible need for additional procedures.  The likelihood of improvement in symptoms and return to the patient's normal status is good. We discussed the typical post-operative recovery course.Patient voiced understanding and decided to proceed. Tentatively plan for an outpatient laparoscopic cholecystectomy on Tuesday, October 30.      Time spent with the patient was 45 minutes, with more than 50% of the time spent in face-to-face education, counseling and care coordination.     Ricarda Frame, MD FACS General Surgeon 03/09/2017, 3:11 PM

## 2017-03-10 ENCOUNTER — Telehealth: Payer: Self-pay | Admitting: General Surgery

## 2017-03-10 NOTE — Telephone Encounter (Signed)
Pt advised of pre op date/time and sx date. Sx: 03/24/17 with Dr Devoria Albe cholecystectomy with gram.  Pre op: 03/17/17 between 1-5:00--Phone interview.   Patient made aware to call [phone], between 1-3:00pm the day before surgery, to find out what time to arrive.  \

## 2017-03-12 ENCOUNTER — Telehealth: Payer: Self-pay

## 2017-03-12 LAB — URINE CULTURE: Culture: 2000 — AB

## 2017-03-12 MED ORDER — AMOXICILLIN-POT CLAVULANATE 875-125 MG PO TABS: 1.0000 | ORAL_TABLET | Freq: Two times a day (BID) | ORAL | 0 refills | Status: DC

## 2017-03-12 NOTE — Telephone Encounter (Signed)
Called patient to let her know about her urine and urine culture. I told her that Dr. Tonita CongWoodham wanted her to start taking Amoxicillin for 10 days. Patient was told to start as soon as possible and to finish it. Patient understood and had no further questions.

## 2017-03-17 ENCOUNTER — Encounter: Admission: RE | Admit: 2017-03-17 | Payer: Medicaid Other | Source: Ambulatory Visit

## 2017-03-18 ENCOUNTER — Inpatient Hospital Stay: Admission: RE | Admit: 2017-03-18 | Payer: Medicaid Other | Source: Ambulatory Visit

## 2017-03-19 ENCOUNTER — Inpatient Hospital Stay: Admission: RE | Admit: 2017-03-19 | Payer: Medicaid Other | Source: Ambulatory Visit

## 2017-03-20 ENCOUNTER — Inpatient Hospital Stay: Admission: RE | Admit: 2017-03-20 | Payer: Medicaid Other | Source: Ambulatory Visit

## 2017-03-23 ENCOUNTER — Encounter
Admission: RE | Admit: 2017-03-23 | Discharge: 2017-03-23 | Disposition: A | Discharge status: Home / Self Care | Payer: Medicaid Other | Source: Ambulatory Visit | Attending: General Surgery | Admitting: General Surgery

## 2017-03-23 HISTORY — DX: Gastro-esophageal reflux disease without esophagitis: K21.9

## 2017-03-23 HISTORY — DX: Unspecified convulsions: R56.9

## 2017-03-23 MED ORDER — CEFAZOLIN SODIUM-DEXTROSE 2-4 GM/100ML-% IV SOLN
2.0000 g | INTRAVENOUS | Status: AC
  Administered 2017-03-24: 2 g via INTRAVENOUS

## 2017-03-23 NOTE — Patient Instructions (Signed)
  Your procedure is scheduled on: 03-24-17  Report to Same Day Surgery 2nd floor medical mall [facility](Medical Mall Entrance-take elevator on left to 2nd floor.  Check in with surgery information desk.) @ 10 AM PER PT   Remember: Instructions that are not followed completely may result in serious medical risk, up to and including death, or upon the discretion of your surgeon and anesthesiologist your surgery may need to be rescheduled.    _x___ 1. Do not eat food after midnight the night before your procedure. NO GUM CHEWING OR HARD CANDIES.  You may drink clear liquids up to 2 hours before you are scheduled to arrive at the hospital for your procedure.  Do not drink clear liquids within 2 hours of your scheduled arrival to the hospital.  Clear liquids include  --Water or Apple juice without pulp  --Clear carbohydrate beverage such as ClearFast or Gatorade  --Black Coffee or Clear Tea (No milk, no creamers, do not add anything to the coffee or Tea     __x__ 2. No Alcohol for 24 hours before or after surgery.   __x__3. No Smoking for 24 prior to surgery.   ____  4. Bring all medications with you on the day of surgery if instructed.    __x__ 5. Notify your doctor if there is any change in your medical condition     (cold, fever, infections).     Do not wear jewelry, make-up, hairpins, clips or nail polish.  Do not wear lotions, powders, or perfumes. You may wear deodorant.  Do not shave 48 hours prior to surgery. Men may shave face and neck.  Do not bring valuables to the hospital.    [facility] is [facility] for any belongings or valuables.               Contacts, dentures or bridgework may not be worn into surgery.  Leave your suitcase in the car. After surgery it may be brought to your room.  For patients admitted to the hospital, discharge time is determined by your treatment team.   Patients discharged the day of surgery will not be allowed to drive home.  You will need someone to  drive you home and stay with you the night of your procedure.    Please read over the following fact sheets that you were given:    ____ Take anti-hypertensive listed below, cardiac, seizure, asthma, anti-reflux and psychiatric medicines. These include:  1. NONE  2.  3.  4.  5.  6.  ____Fleets enema or Magnesium Citrate as directed.   ____ Use CHG Soap or sage wipes as directed on instruction sheet   ____ Use inhalers on the day of surgery and bring to hospital day of surgery  ____ Stop Metformin and Janumet 2 days prior to surgery.    ____ Take 1/2 of usual insulin dose the night before surgery and none on the morning surgery.   ____ Follow recommendations from Cardiologist, Pulmonologist or PCP regarding stopping Aspirin, Coumadin, Plavix ,Eliquis, Effient, or Pradaxa, and Pletal.  X____Stop Anti-inflammatories such as Advil, Aleve, Ibuprofen, Motrin, Naproxen, Naprosyn, Goodies powders or aspirin products NOW- OK to take Tylenol    ____ Stop supplements until after surgery.   ____ Bring C-Pap to the hospital.

## 2017-03-24 ENCOUNTER — Encounter
Admission: RE | Disposition: A | Discharge status: Home / Self Care | Payer: Self-pay | Source: Ambulatory Visit | Attending: General Surgery

## 2017-03-24 ENCOUNTER — Ambulatory Visit
Admission: RE | Admit: 2017-03-24 | Discharge: 2017-03-24 | Disposition: A | Discharge status: Home / Self Care | Payer: Medicaid Other | Source: Ambulatory Visit | Attending: General Surgery | Admitting: General Surgery

## 2017-03-24 ENCOUNTER — Encounter: Payer: Self-pay | Admitting: Anesthesiology

## 2017-03-24 ENCOUNTER — Ambulatory Visit: Payer: Medicaid Other | Admitting: Registered Nurse

## 2017-03-24 DIAGNOSIS — R109 Unspecified abdominal pain: Secondary | ICD-10-CM | POA: Diagnosis present

## 2017-03-24 DIAGNOSIS — K811 Chronic cholecystitis: Secondary | ICD-10-CM | POA: Insufficient documentation

## 2017-03-24 DIAGNOSIS — K8050 Calculus of bile duct without cholangitis or cholecystitis without obstruction: Secondary | ICD-10-CM | POA: Diagnosis not present

## 2017-03-24 HISTORY — PX: CHOLECYSTECTOMY: SHX55

## 2017-03-24 LAB — POCT PREGNANCY, URINE: PREG TEST UR: NEGATIVE

## 2017-03-24 SURGERY — LAPAROSCOPIC CHOLECYSTECTOMY
Anesthesia: General | Wound class: Clean Contaminated

## 2017-03-24 MED ORDER — FAMOTIDINE 20 MG PO TABS
20.0000 mg | ORAL_TABLET | Freq: Once | ORAL | Status: AC
  Administered 2017-03-24: 20 mg via ORAL

## 2017-03-24 MED ORDER — OXYCODONE HCL 5 MG/5ML PO SOLN: 5.0000 mg | Freq: Once | ORAL | Status: DC | PRN

## 2017-03-24 MED ORDER — LIDOCAINE HCL (PF) 2 % IJ SOLN
INTRAMUSCULAR | Status: AC
Start: 2017-03-24 — End: 2017-03-24
  Filled 2017-03-24: qty 10

## 2017-03-24 MED ORDER — OXYCODONE HCL 5 MG PO TABS: 5.0000 mg | ORAL_TABLET | Freq: Once | ORAL | Status: DC | PRN

## 2017-03-24 MED ORDER — DEXAMETHASONE SODIUM PHOSPHATE 10 MG/ML IJ SOLN
INTRAMUSCULAR | Status: DC | PRN
  Administered 2017-03-24: 5 mg via INTRAVENOUS

## 2017-03-24 MED ORDER — MIDAZOLAM HCL 2 MG/2ML IJ SOLN
INTRAMUSCULAR | Status: DC | PRN
  Administered 2017-03-24: 2 mg via INTRAVENOUS

## 2017-03-24 MED ORDER — OXYCODONE-ACETAMINOPHEN 5-325 MG PO TABS: 1.0000 | ORAL_TABLET | ORAL | 0 refills | Status: DC | PRN

## 2017-03-24 MED ORDER — FENTANYL CITRATE (PF) 100 MCG/2ML IJ SOLN
INTRAMUSCULAR | Status: DC | PRN
  Administered 2017-03-24: 25 ug via INTRAVENOUS
  Administered 2017-03-24: 100 ug via INTRAVENOUS
  Administered 2017-03-24: 50 ug via INTRAVENOUS
  Administered 2017-03-24: 25 ug via INTRAVENOUS

## 2017-03-24 MED ORDER — LIDOCAINE HCL (CARDIAC) 20 MG/ML IV SOLN
INTRAVENOUS | Status: DC | PRN
  Administered 2017-03-24: 50 mg via INTRAVENOUS

## 2017-03-24 MED ORDER — PROPOFOL 10 MG/ML IV BOLUS
INTRAVENOUS | Status: DC | PRN
  Administered 2017-03-24: 150 mg via INTRAVENOUS

## 2017-03-24 MED ORDER — DEXAMETHASONE SODIUM PHOSPHATE 10 MG/ML IJ SOLN
INTRAMUSCULAR | Status: AC
  Filled 2017-03-24: qty 1

## 2017-03-24 MED ORDER — CEFAZOLIN SODIUM-DEXTROSE 2-4 GM/100ML-% IV SOLN
INTRAVENOUS | Status: AC
  Filled 2017-03-24: qty 100

## 2017-03-24 MED ORDER — BUPIVACAINE HCL (PF) 0.5 % IJ SOLN
INTRAMUSCULAR | Status: AC
  Filled 2017-03-24: qty 30

## 2017-03-24 MED ORDER — SUGAMMADEX SODIUM 200 MG/2ML IV SOLN
INTRAVENOUS | Status: AC
  Filled 2017-03-24: qty 2

## 2017-03-24 MED ORDER — FAMOTIDINE 20 MG PO TABS
ORAL_TABLET | ORAL | Status: AC
  Administered 2017-03-24: 20 mg via ORAL
  Filled 2017-03-24: qty 1

## 2017-03-24 MED ORDER — KETOROLAC TROMETHAMINE 30 MG/ML IJ SOLN
INTRAMUSCULAR | Status: AC
  Filled 2017-03-24: qty 1

## 2017-03-24 MED ORDER — SUGAMMADEX SODIUM 200 MG/2ML IV SOLN
INTRAVENOUS | Status: DC | PRN
  Administered 2017-03-24: 200 mg via INTRAVENOUS

## 2017-03-24 MED ORDER — ROCURONIUM BROMIDE 100 MG/10ML IV SOLN
INTRAVENOUS | Status: DC | PRN
Start: 2017-03-24 — End: 2017-03-24
  Administered 2017-03-24: 40 mg via INTRAVENOUS

## 2017-03-24 MED ORDER — LIDOCAINE HCL 1 % IJ SOLN
INTRAMUSCULAR | Status: DC | PRN
  Administered 2017-03-24: 30 mL via SUBCUTANEOUS

## 2017-03-24 MED ORDER — LACTATED RINGERS IV SOLN
INTRAVENOUS | Status: DC
  Administered 2017-03-24: 11:00:00 via INTRAVENOUS

## 2017-03-24 MED ORDER — FENTANYL CITRATE (PF) 100 MCG/2ML IJ SOLN
INTRAMUSCULAR | Status: AC
  Administered 2017-03-24: 25 ug via INTRAVENOUS
  Filled 2017-03-24: qty 2

## 2017-03-24 MED ORDER — CHLORHEXIDINE GLUCONATE CLOTH 2 % EX PADS
6.0000 | MEDICATED_PAD | Freq: Once | CUTANEOUS | Status: AC
  Administered 2017-03-24: 6 via TOPICAL

## 2017-03-24 MED ORDER — ROCURONIUM BROMIDE 50 MG/5ML IV SOLN
INTRAVENOUS | Status: AC
  Filled 2017-03-24: qty 1

## 2017-03-24 MED ORDER — ACETAMINOPHEN 10 MG/ML IV SOLN
INTRAVENOUS | Status: DC | PRN
  Administered 2017-03-24: 1000 mg via INTRAVENOUS

## 2017-03-24 MED ORDER — KETOROLAC TROMETHAMINE 30 MG/ML IJ SOLN
INTRAMUSCULAR | Status: DC | PRN
  Administered 2017-03-24: 30 mg via INTRAVENOUS

## 2017-03-24 MED ORDER — FENTANYL CITRATE (PF) 100 MCG/2ML IJ SOLN
INTRAMUSCULAR | Status: AC
  Filled 2017-03-24: qty 2

## 2017-03-24 MED ORDER — FENTANYL CITRATE (PF) 100 MCG/2ML IJ SOLN
25.0000 ug | INTRAMUSCULAR | Status: DC | PRN
  Administered 2017-03-24: 25 ug via INTRAVENOUS

## 2017-03-24 MED ORDER — ACETAMINOPHEN 10 MG/ML IV SOLN
INTRAVENOUS | Status: AC
  Filled 2017-03-24: qty 100

## 2017-03-24 MED ORDER — LIDOCAINE HCL (PF) 1 % IJ SOLN
INTRAMUSCULAR | Status: AC
  Filled 2017-03-24: qty 30

## 2017-03-24 MED ORDER — PROPOFOL 10 MG/ML IV BOLUS
INTRAVENOUS | Status: AC
  Filled 2017-03-24: qty 20

## 2017-03-24 MED ORDER — MIDAZOLAM HCL 2 MG/2ML IJ SOLN
INTRAMUSCULAR | Status: AC
  Filled 2017-03-24: qty 2

## 2017-03-24 MED ORDER — ONDANSETRON HCL 4 MG/2ML IJ SOLN
INTRAMUSCULAR | Status: AC
  Filled 2017-03-24: qty 2

## 2017-03-24 SURGICAL SUPPLY — 42 items
ADHESIVE MASTISOL STRL (MISCELLANEOUS) ×2 IMPLANT
APPLIER CLIP ROT 10 11.4 M/L (STAPLE) ×2
BLADE SURG SZ11 CARB STEEL (BLADE) ×2 IMPLANT
CANISTER SUCT 1200ML W/VALVE (MISCELLANEOUS) ×2 IMPLANT
CATH CHOLANG 76X19 KUMAR (CATHETERS) ×2 IMPLANT
CHLORAPREP W/TINT 26ML (MISCELLANEOUS) ×2 IMPLANT
CLIP APPLIE ROT 10 11.4 M/L (STAPLE) ×1 IMPLANT
CONRAY 60ML FOR OR (MISCELLANEOUS) IMPLANT
DECANTER SPIKE VIAL GLASS SM (MISCELLANEOUS) IMPLANT
DRAPE SHEET LG 3/4 BI-LAMINATE (DRAPES) IMPLANT
DRSG TEGADERM 2-3/8X2-3/4 SM (GAUZE/BANDAGES/DRESSINGS) ×8 IMPLANT
DRSG TELFA 4X3 1S NADH ST (GAUZE/BANDAGES/DRESSINGS) ×2 IMPLANT
ELECT REM PT RETURN 9FT ADLT (ELECTROSURGICAL) ×2
ELECTRODE REM PT RTRN 9FT ADLT (ELECTROSURGICAL) ×1 IMPLANT
GLOVE BIO SURGEON STRL SZ7.5 (GLOVE) ×2 IMPLANT
GLOVE INDICATOR 8.0 STRL GRN (GLOVE) ×2 IMPLANT
GOWN STRL REUS W/ TWL LRG LVL3 (GOWN DISPOSABLE) ×3 IMPLANT
GOWN STRL REUS W/TWL LRG LVL3 (GOWN DISPOSABLE) ×3
GRASPER SUT TROCAR 14GX15 (MISCELLANEOUS) IMPLANT
IRRIGATION STRYKERFLOW (MISCELLANEOUS) ×1 IMPLANT
IRRIGATOR STRYKERFLOW (MISCELLANEOUS) ×2
IV NS 1000ML (IV SOLUTION)
IV NS 1000ML BAXH (IV SOLUTION) IMPLANT
L-HOOK LAP DISP 36CM (ELECTROSURGICAL) ×2
LABEL OR SOLS (LABEL) ×2 IMPLANT
LHOOK LAP DISP 36CM (ELECTROSURGICAL) ×1 IMPLANT
NEEDLE HYPO 25X1 1.5 SAFETY (NEEDLE) ×2 IMPLANT
NEEDLE VERESS 14GA 120MM (NEEDLE) ×2 IMPLANT
NS IRRIG 500ML POUR BTL (IV SOLUTION) ×2 IMPLANT
PACK LAP CHOLECYSTECTOMY (MISCELLANEOUS) ×2 IMPLANT
PENCIL ELECTRO HAND CTR (MISCELLANEOUS) ×2 IMPLANT
POUCH ENDO CATCH 10MM SPEC (MISCELLANEOUS) ×2 IMPLANT
SCISSORS METZENBAUM CVD 33 (INSTRUMENTS) ×2 IMPLANT
SLEEVE ENDOPATH XCEL 5M (ENDOMECHANICALS) ×4 IMPLANT
STRIP CLOSURE SKIN 1/2X4 (GAUZE/BANDAGES/DRESSINGS) IMPLANT
SUT MNCRL 4-0 (SUTURE) ×1
SUT MNCRL 4-0 27XMFL (SUTURE) ×1
SUT VICRYL 0 AB UR-6 (SUTURE) IMPLANT
SUTURE MNCRL 4-0 27XMF (SUTURE) ×1 IMPLANT
TROCAR XCEL 12X100 BLDLESS (ENDOMECHANICALS) ×2 IMPLANT
TROCAR XCEL NON-BLD 5MMX100MML (ENDOMECHANICALS) ×2 IMPLANT
TUBING INSUFFLATOR HI FLOW (MISCELLANEOUS) ×2 IMPLANT

## 2017-03-24 NOTE — Progress Notes (Signed)
Dr GVS called     In to see pt

## 2017-03-24 NOTE — Discharge Instructions (Signed)
Laparoscopic Cholecystectomy, Care After This sheet gives you information about how to care for yourself after your procedure. Your health care provider may also give you more specific instructions. If you have problems or questions, contact your health care provider. What can I expect after the procedure? After the procedure, it is common to have:  Pain at your incision sites. You will be given medicines to control this pain.  Mild nausea or vomiting.  Bloating and possible shoulder pain from the air-like gas that was used during the procedure.  Follow these instructions at home: Incision care   Follow instructions from your health care provider about how to take care of your incisions. Make sure you: ? Wash your hands with soap and water before you change your bandage (dressing). If soap and water are not available, use hand sanitizer. ? Change your dressing as told by your health care provider.  Leave initial dressing on for 72 hours.  After which change daily with large Band-Aids until dressing is clean when it is removed. ? Leave stitches (sutures), skin glue, or adhesive strips in place. These skin closures may need to be in place for 2 weeks or longer. If adhesive strip edges start to loosen and curl up, you may trim the loose edges. Do not remove adhesive strips completely unless your health care provider tells you to do that.  Do not take baths, swim, or use a hot tub until your health care provider approves. Ask your health care provider if you can take showers. You may only be allowed to take sponge baths for bathing.  Okay to shower in 24 hours.  Check your incision area every day for signs of infection. Check for: ? More redness, swelling, or pain. ? More fluid or blood. ? Warmth. ? Pus or a bad smell. Activity  Do not drive or use heavy machinery while taking prescription pain medicine.  Do not lift anything that is heavier than 10 lb (4.5 kg) until your health care  provider approves.  Do not play contact sports until your health care provider approves.  Do not drive for 24 hours if you were given a medicine to help you relax (sedative).  Rest as needed. Do not return to work or school until your health care provider approves. General instructions  Take over-the-counter and prescription medicines only as told by your health care provider.  To prevent or treat constipation while you are taking prescription pain medicine, your health care provider may recommend that you: ? Drink enough fluid to keep your urine clear or pale yellow. ? Take over-the-counter or prescription medicines. ? Eat foods that are high in fiber, such as fresh fruits and vegetables, whole grains, and beans. ? Limit foods that are high in fat and processed sugars, such as fried and sweet foods. Contact a health care provider if:  You develop a rash.  You have more redness, swelling, or pain around your incisions.  You have more fluid or blood coming from your incisions.  Your incisions feel warm to the touch.  You have pus or a bad smell coming from your incisions.  You have a fever.  One or more of your incisions breaks open. Get help right away if:  You have trouble breathing.  You have chest pain.  You have increasing pain in your shoulders.  You faint or feel dizzy when you stand.  You have severe pain in your abdomen.  You have nausea or vomiting that lasts for more  than one day.  You have leg pain. This information is not intended to replace advice given to you by your health care provider. Make sure you discuss any questions you have with your health care provider. Document Released: 05/12/2005 Document Revised: 12/01/2015 Document Reviewed: 10/29/2015 Elsevier Interactive Patient Education  2017 [employer]    AMBULATORY SURGERY  DISCHARGE INSTRUCTIONS   1) The drugs that you were given will stay in your system until tomorrow so for the next 24  hours you should not:  A) Drive an automobile B) Make any legal decisions C) Drink any alcoholic beverage   2) You may resume regular meals tomorrow.  Today it is better to start with liquids and gradually work up to solid foods.  You may eat anything you prefer, but it is better to start with liquids, then soup and crackers, and gradually work up to solid foods.   3) Please notify your doctor immediately if you have any unusual bleeding, trouble breathing, redness and pain at the surgery site, drainage, fever, or pain not relieved by medication.   Additional Instructions:Please Drink Drink and Drink some more today.  You should only eat mild and bland food.  Please have something solid in your tummy before taking pain medicine.  Start with the least amount (1 tablet) .  Do not take 2 tablets at any one time until you have become fully awake and aware.  You must take stool softeners along with pain medication.  Use a support pillow against your abdomen for coughing, sneezing, laughing or changing positions.  Make sure to deep breathe and cough when awake to keep your lungs expanded and help to minimize any chance of pneumonia after surgery.     Please contact your physician with any problems or Same Day Surgery at [phone], Monday through Friday 6 am to 4 pm, or [place] at [facility] Main number at [phone].

## 2017-03-24 NOTE — Anesthesia Post-op Follow-up Note (Signed)
Anesthesia QCDR form completed.        

## 2017-03-24 NOTE — Brief Op Note (Signed)
03/24/2017  12:53 PM  PATIENT:  Candice Lewis  21 y.o. female  PRE-OPERATIVE DIAGNOSIS:  abdominal pain right upper quadrant  POST-OPERATIVE DIAGNOSIS:  abdominal pain right upper quadrant  PROCEDURE:  Procedure(s): LAPAROSCOPIC CHOLECYSTECTOMY (N/A)  SURGEON:  Surgeon(s) and Role:    Ricarda Frame* Matther Labell, MD - Primary  PHYSICIAN ASSISTANT:   ASSISTANTS: none   ANESTHESIA:   general  EBL:  15ml   BLOOD ADMINISTERED:none  DRAINS: none   LOCAL MEDICATIONS USED:  MARCAINE   , XYLOCAINE  and Amount: 30 ml  SPECIMEN:  Source of Specimen:  gallbladder  DISPOSITION OF SPECIMEN:  PATHOLOGY  COUNTS:  YES  TOURNIQUET:  * No tourniquets in log *  DICTATION: .Dragon Dictation  PLAN OF CARE: Discharge to home after PACU  PATIENT DISPOSITION:  PACU - hemodynamically stable.   Delay start of Pharmacological VTE agent (>24hrs) due to surgical blood loss or risk of bleeding: not applicable

## 2017-03-24 NOTE — Progress Notes (Signed)
States chest hurts  Sit pt up and belched

## 2017-03-24 NOTE — Transfer of Care (Signed)
Immediate Anesthesia Transfer of Care Note  Patient: Candice Lewis  Procedure(s) Performed: LAPAROSCOPIC CHOLECYSTECTOMY (N/A )  Patient Location: PACU  Anesthesia Type:General  Level of Consciousness: awake, alert  and oriented  Airway & Oxygen Therapy: Patient Spontanous Breathing and Patient connected to face mask oxygen  Post-op Assessment: Report given to RN and Post -op Vital signs reviewed and stable  Post vital signs: Reviewed and stable  Last Vitals:  Vitals:   03/24/17 1303  BP: (!) 119/55  Pulse: 87  Resp: 19  Temp: (!) 36.4 C  SpO2: 100%    Last Pain:  Vitals:   03/24/17 1303  TempSrc: Temporal         Complications: No apparent anesthesia complications

## 2017-03-24 NOTE — Anesthesia Postprocedure Evaluation (Signed)
Anesthesia Post Note  Patient: Candice PereyraJosee D Muscarella  Procedure(s) Performed: LAPAROSCOPIC CHOLECYSTECTOMY (N/A )  Patient location during evaluation: PACU Anesthesia Type: General Level of consciousness: awake Pain management: pain level controlled Vital Signs Assessment: post-procedure vital signs reviewed and stable Respiratory status: nonlabored ventilation Cardiovascular status: stable Anesthetic complications: no     Last Vitals:  Vitals:   03/24/17 1328 03/24/17 1332  BP:  115/80  Pulse: 74 72  Resp: 16 12  Temp:    SpO2: 100% 100%    Last Pain:  Vitals:   03/24/17 1332  TempSrc:   PainSc: 9                  VAN STAVEREN,Gurvir Schrom

## 2017-03-24 NOTE — Progress Notes (Signed)
Dr GVS in to see pt   Pt very sleepy but arousable when stimulated   Will keep pt for 20 more minutes

## 2017-03-24 NOTE — H&P (View-Only) (Signed)
Patient ID: Candice Lewis, female   DOB: 07/24/1995, 21 y.o.   MRN: [id number]  CC: Right sided pain  HPI Candice Lewis is a 21 y.o. female presents to clinic today for ER follow-up for right-sided abdominal pain. She states she is known she's had gallstones for a while and she's been having intermittent right-sided abdominal pain for the last month. She's had associated nausea and vomiting a few times over the last week. Most recently being this morning. She reports whenever she something fatty, greasy or fast food she develops the nausea and vomiting. She denies any recent fevers, chills, chest pain, shortness of breath, diarrhea, constipation. She's also been having right-sided back pain and has had numerous dirty urinalysis performed over the last 2 weeks. Patient is also 3 months postpartum.  HPI  Past Medical History:  Diagnosis Date  . Asthma   . Gallstones   . Migraine   . Migraine     Past Surgical History:  Procedure Laterality Date  . NO PAST SURGERIES      Family History  Problem Relation Age of Onset  . Gallstones Mother     Social History Social History  Substance Use Topics  . Smoking status: Never Smoker  . Smokeless tobacco: Never Used  . Alcohol use No    Allergies  Allergen Reactions  . Zofran [Ondansetron Hcl] Other (See Comments)    AMS  . Latex Rash    Current Outpatient Prescriptions  Medication Sig Dispense Refill  . promethazine (PHENERGAN) 12.5 MG tablet Take 1 tablet (12.5 mg total) by mouth every 6 (six) hours as needed for nausea or vomiting. 30 tablet 0   No current facility-administered medications for this visit.      Review of Systems A multi-point review of systems was asked and was negative except for the findings documented in the history of present illness  Physical Exam Blood pressure 124/83, pulse 79, temperature 98.8 F (37.1 C), temperature source Oral, height 5' (1.524 m), weight 88 kg (194 lb), last menstrual period  05/22/2016. CONSTITUTIONAL: no acute distress. EYES: Pupils are equal, round, and reactive to light, Sclera are non-icteric. EARS, NOSE, MOUTH AND THROAT: The oropharynx is clear. The oral mucosa is pink and moist. Hearing is intact to voice. LYMPH NODES:  Lymph nodes in the neck are normal. RESPIRATORY:  Lungs are clear. There is normal respiratory effort, with equal breath sounds bilaterally, and without pathologic use of accessory muscles. CARDIOVASCULAR: Heart is regular without murmurs, gallops, or rubs. GI: The abdomen is soft, tender to palpation in the right upper quadrant but with a negative Murphy sign, and nondistended. Patient is also tender along the right costovertebral angle. There are no palpable masses. There is no hepatosplenomegaly. There are normal bowel sounds in all quadrants. GU: Rectal deferred.   MUSCULOSKELETAL: Normal muscle strength and tone. No cyanosis or edema.   SKIN: Turgor is good and there are no pathologic skin lesions or ulcers. NEUROLOGIC: Motor and sensation is grossly normal. Cranial nerves are grossly intact. PSYCH:  Oriented to person, place and time. Affect is normal.  Data Reviewed Images and labs reviewed most recent labs show a normal white blood cell count of 8.4, normal LFTs with the exception of a mildly elevated alkaline phosphatase at 129 and AST at 71. Normal T-Bili, ALT.remainder of her electrolytes are within normal limits. Urinalysis does show moderate hemoglobin and bacteria and white cells present.culture grew a lactobacillus without susceptibility testing.right upper quadrant ultrasound showed evidence of  cholelithiasis without gallbladder wall thickening, pericholecystic fluid, ductal dilatation. I have personally reviewed the patient's imaging, laboratory findings and medical records.    Assessment    Biliary colic and costovertebral angle tenderness    Plan    21-year-old female with obvious biliary colic but with confounding  costovertebral angle tenderness. Given her to recent dirty urinalyses that were performed discussed that we would repeat the urinalysis today and treat a suspected urinary tract infection shouldit continue to be dirty. Otherwise we discussed the procedure of a laparoscopic cholecystectomy for the treatment of biliary colic in detail. I discussed the procedure in detail.  The patient was given educational material.  We discussed the risks and benefits of a laparoscopic cholecystectomy and possible cholangiogram including, but not limited to bleeding, infection, injury to surrounding structures such as the intestine or liver, bile leak, retained gallstones, need to convert to an open procedure, prolonged diarrhea, blood clots such as  DVT, common bile duct injury, anesthesia risks, and possible need for additional procedures.  The likelihood of improvement in symptoms and return to the patient's normal status is good. We discussed the typical post-operative recovery course.Patient voiced understanding and decided to proceed. Tentatively plan for an outpatient laparoscopic cholecystectomy on Tuesday, October 30.      Time spent with the patient was 45 minutes, with more than 50% of the time spent in face-to-face education, counseling and care coordination.     Danyeal Akens, MD FACS General Surgeon 03/09/2017, 3:11 PM    

## 2017-03-24 NOTE — Progress Notes (Signed)
States stomach hurts but is very sleepy

## 2017-03-24 NOTE — Op Note (Signed)
Laparoscopic Cholecystectomy  Pre-operative Diagnosis: Biliary colic  Post-operative Diagnosis: Same  Procedure: Laparoscopic cholecystectomy  Surgeon: Leonette Mostharles T. Tonita CongWoodham, MD FACS  Anesthesia: Gen. with endotracheal tube  Assistant: None  Procedure Details  The patient was seen again in the Holding Room. The benefits, complications, treatment options, and expected outcomes were discussed with the patient. The risks of bleeding, infection, recurrence of symptoms, failure to resolve symptoms, bile duct damage, bile duct leak, retained common bile duct stone, bowel injury, any of which could require further surgery and/or ERCP, stent, or papillotomy were reviewed with the patient. The likelihood of improving the patient's symptoms with return to their baseline status is good.  The patient and/or family concurred with the proposed plan, giving informed consent.  The patient was taken to Operating Room, identified as Candice Lewis and the procedure verified as Laparoscopic Cholecystectomy.  A Time Out was held and the above information confirmed.  Prior to the induction of general anesthesia, antibiotic prophylaxis was administered. VTE prophylaxis was in place. General endotracheal anesthesia was then administered and tolerated well. After the induction, the abdomen was prepped with Chloraprep and draped in the sterile fashion. The patient was positioned in the supine position.  Local anesthetic  was injected into the skin near the umbilicus and an incision made. The Veress needle was placed. Pneumoperitoneum was then created with CO2 and tolerated well without any adverse changes in the patient's vital signs. A 5mm port was placed in the periumbilical position and the abdominal cavity was explored.  Two 5-mm ports were placed in the right upper quadrant and a 12 mm epigastric port was placed all under direct vision. All skin incisions  were infiltrated with a local anesthetic agent before making the  incision and placing the trocars.   The patient was positioned  in reverse Trendelenburg, tilted slightly to the patient's left.  The gallbladder was identified, the fundus grasped and retracted cephalad. Adhesions were lysed bluntly. The infundibulum was grasped and retracted laterally, exposing the peritoneum overlying the triangle of Calot. This was then divided and exposed in a blunt fashion. A critical view of the cystic duct and cystic artery was obtained.  The cystic duct was clearly identified and bluntly dissected.   The cystic duct and cystic artery were serially clipped with endoclips at the same time.  They were then cut in between the endoscopic shears. The gallbladder was taken from the gallbladder fossa in a retrograde fashion with the electrocautery.  An additional bleeding artery was encountered approximately halfway up the gallbladder fossa which was controlled with endoclips.  The gallbladder was removed and placed in an Endocatch bag. The liver bed was irrigated and inspected. Hemostasis was achieved with the electrocautery. Copious irrigation was utilized and was repeatedly aspirated until clear.  The gallbladder and Endocatch sac were then removed through the epigastric port site.   Inspection of the right upper quadrant was performed. No bleeding, bile duct injury or leak, or bowel injury was noted. Pneumoperitoneum was released.  The epigastric port site was closed with figure-of-eight 0 Vicryl sutures. 4-0 subcuticular Monocryl was used to close the skin. Steristrips and Mastisol and sterile dressings were  applied.  The patient was then extubated and brought to the recovery room in stable condition. Sponge, lap, and needle counts were correct at closure and at the conclusion of the case.   Findings: Cholelithiasis  Estimated Blood Loss: 20 mL         Drains: None  Specimens: Gallbladder           Complications: none               Candice Lewis T. Tonita Cong, MD,  FACS

## 2017-03-24 NOTE — Anesthesia Preprocedure Evaluation (Addendum)
Anesthesia Evaluation  Patient identified by MRN, date of birth, ID band Patient awake    Reviewed: Allergy & Precautions, H&P , NPO status , Patient's Chart, lab work & pertinent test results  History of Anesthesia Complications Negative for: history of anesthetic complications  Airway Mallampati: II  TM Distance: >3 FB Neck ROM: full    Dental  (+) Chipped   Pulmonary neg shortness of breath, asthma ,           Cardiovascular Exercise Tolerance: Good (-) angina(-) Past MI and (-) DOE negative cardio ROS       Neuro/Psych  Headaches, Seizures -, Well Controlled,  negative psych ROS   GI/Hepatic Neg liver ROS, GERD  Medicated and Controlled,  Endo/Other  negative endocrine ROS  Renal/GU      Musculoskeletal   Abdominal   Peds  Hematology negative hematology ROS (+)   Anesthesia Other Findings Past Medical History: No date: Asthma     Comment:  WELL CONTROLLED-NO INHALERS CURRENTLY  No date: Gallstones No date: GERD (gastroesophageal reflux disease)     Comment:  OCC-NO MEDS No date: Migraine No date: Migraine No date: Seizures (HCC)     Comment:  POSSIBLE AT AGE 15 OR 13 X 1-PT STATES UNSURE IF IT WAS               A DEFINITE SEIZURE  Past Surgical History: No date: WISDOM TOOTH EXTRACTION     Reproductive/Obstetrics negative OB ROS                             Anesthesia Physical Anesthesia Plan  ASA: III  Anesthesia Plan: General ETT   Post-op Pain Management:    Induction: Intravenous  PONV Risk Score and Plan: 4 or greater and Dexamethasone, Midazolam and Treatment may vary due to age or medical condition  Airway Management Planned: Oral ETT  Additional Equipment:   Intra-op Plan:   Post-operative Plan: Extubation in OR  Informed Consent: I have reviewed the patients History and Physical, chart, labs and discussed the procedure including the risks, benefits  and alternatives for the proposed anesthesia with the patient or authorized representative who has indicated his/her understanding and acceptance.   Dental Advisory Given  Plan Discussed with: Anesthesiologist, CRNA and Surgeon  Anesthesia Plan Comments: (Patient consented for risks of anesthesia including but not limited to:  - adverse reactions to medications - damage to teeth, lips or other oral mucosa - sore throat or hoarseness - Damage to heart, brain, lungs or loss of life  Patient voiced understanding.)       Anesthesia Quick Evaluation

## 2017-03-24 NOTE — Addendum Note (Signed)
Addendum  created 03/24/17 1431 by Tykeria Wawrzyniak, Cleda MccreedyJoseph K, MD   Anesthesia Event edited

## 2017-03-24 NOTE — Interval H&P Note (Signed)
History and Physical Interval Note:  03/24/2017 11:03 AM  Candice Lewis  has presented today for surgery, with the diagnosis of abdominal pain right upper quadrant  The various methods of treatment have been discussed with the patient and family. After consideration of risks, benefits and other options for treatment, the patient has consented to  Procedure(s): LAPAROSCOPIC CHOLECYSTECTOMY (N/A) as a surgical intervention .  The patient's history has been reviewed, patient examined, no change in status, stable for surgery.  I have reviewed the patient's chart and labs.  Questions were answered to the patient's satisfaction.     [name]

## 2017-03-24 NOTE — Anesthesia Procedure Notes (Addendum)
Procedure Name: Intubation Date/Time: 03/24/2017 12:05 PM Performed by: Jonna Clark Pre-anesthesia Checklist: Patient identified, Patient being monitored, Timeout performed, Emergency Drugs available and Suction available Patient Re-evaluated:Patient Re-evaluated prior to induction Oxygen Delivery Method: Circle system utilized Preoxygenation: Pre-oxygenation with 100% oxygen Induction Type: IV induction Ventilation: Mask ventilation without difficulty Laryngoscope Size: Mac and 3 Grade View: Grade I Tube type: Oral Tube size: 7.0 mm Number of attempts: 1 Airway Equipment and Method: Stylet Placement Confirmation: ETT inserted through vocal cords under direct vision,  positive ETCO2 and breath sounds checked- equal and bilateral Secured at: 23 cm Tube secured with: Tape Dental Injury: Teeth and Oropharynx as per pre-operative assessment

## 2017-03-24 NOTE — Progress Notes (Signed)
Pt very sleepy but does awaken after much stimulation

## 2017-03-25 LAB — SURGICAL PATHOLOGY

## 2017-04-01 ENCOUNTER — Ambulatory Visit (INDEPENDENT_AMBULATORY_CARE_PROVIDER_SITE_OTHER): Payer: Medicaid Other | Admitting: General Surgery

## 2017-04-01 ENCOUNTER — Encounter: Payer: Self-pay | Admitting: General Surgery

## 2017-04-01 VITALS — BP 110/75 | HR 71 | Temp 97.8°F | Ht 60.0 in | Wt 198.0 lb

## 2017-04-01 DIAGNOSIS — Z4889 Encounter for other specified surgical aftercare: Secondary | ICD-10-CM

## 2017-04-01 NOTE — Progress Notes (Signed)
Outpatient Surgical Follow Up  04/01/2017  Candice Lewis is an 21 y.o. female.   Chief Complaint  Patient presents with  . Routine Post Op    Post op: Laparoscopic Cholecystectomy-03-24-17 Dr.Yoneko Talerico    HPI: 21 year old female returns to clinic 1 week status post laparoscopic cholecystectomy.  The pain she had before surgery has completely resolved.  Patient reports doing very well.  She has not required a pain pill for at least the last 48 hours.  She is eating well and having normal bowel function.  She denies any fevers, chills, nausea, vomiting, chest pain, shortness of breath, diarrhea, constipation.  She has already returned to work.  Past Medical History:  Diagnosis Date  . Asthma    WELL CONTROLLED-NO INHALERS CURRENTLY   . Gallstones   . GERD (gastroesophageal reflux disease)    OCC-NO MEDS  . Migraine   . Migraine   . Seizures (HCC)    POSSIBLE AT AGE 65 OR 14 X 1-PT STATES UNSURE IF IT WAS A DEFINITE SEIZURE    Past Surgical History:  Procedure Laterality Date  . WISDOM TOOTH EXTRACTION      Family History  Problem Relation Age of Onset  . Gallstones Mother     Social History:  reports that  has never smoked. she has never used smokeless tobacco. She reports that she does not drink alcohol or use drugs.  Allergies:  Allergies  Allergen Reactions  . Zofran [Ondansetron Hcl] Other (See Comments)    AMS  . Latex Rash    Medications reviewed.    ROS A multipoint review of systems was completed, all pertinent positives and negatives are documented within the HPI and the remainder are negative   BP 110/75   Pulse 71   Temp 97.8 F (36.6 C) (Oral)   Ht 5' (1.524 m)   Wt 89.8 kg (198 lb)   LMP 05/22/2016   BMI 38.67 kg/m   Physical Exam General: No acute distress Chest: Clear to auscultation Heart: Regular rate and rhythm Abdomen: Soft, nontender, nondistended.  Well approximated laparoscopic incision sites without evidence of erythema or  drainage.    No results found for this or any previous visit (from the past 48 hour(s)). No results found.  Assessment/Plan:  1. Aftercare following surgery 21 year old female status post laparoscopic cholecystectomy.  Pathology reviewed with the patient.  Provided with standard postoperative precautions and return instructions for clinic.  She voiced understanding and will follow up on an as needed basis.     Ricarda Frameharles Wisdom Seybold, MD FACS General Surgeon  04/01/2017,9:14 AM

## 2017-04-01 NOTE — Patient Instructions (Signed)

## 2017-06-19 ENCOUNTER — Emergency Department
Admission: EM | Admit: 2017-06-19 | Discharge: 2017-06-19 | Disposition: A | Discharge status: Home / Self Care | Payer: Medicaid Other | Attending: Emergency Medicine | Admitting: Emergency Medicine

## 2017-06-19 ENCOUNTER — Other Ambulatory Visit: Payer: Self-pay

## 2017-06-19 ENCOUNTER — Encounter: Payer: Self-pay | Admitting: Emergency Medicine

## 2017-06-19 DIAGNOSIS — J45909 Unspecified asthma, uncomplicated: Secondary | ICD-10-CM | POA: Diagnosis not present

## 2017-06-19 DIAGNOSIS — K529 Noninfective gastroenteritis and colitis, unspecified: Secondary | ICD-10-CM | POA: Insufficient documentation

## 2017-06-19 DIAGNOSIS — Z91040 Latex allergy status: Secondary | ICD-10-CM | POA: Insufficient documentation

## 2017-06-19 DIAGNOSIS — R6883 Chills (without fever): Secondary | ICD-10-CM | POA: Insufficient documentation

## 2017-06-19 DIAGNOSIS — R112 Nausea with vomiting, unspecified: Secondary | ICD-10-CM | POA: Diagnosis present

## 2017-06-19 LAB — URINALYSIS, COMPLETE (UACMP) WITH MICROSCOPIC
BACTERIA UA: NONE SEEN
Bilirubin Urine: NEGATIVE
GLUCOSE, UA: NEGATIVE mg/dL
HGB URINE DIPSTICK: NEGATIVE
Ketones, ur: 5 mg/dL — AB
Leukocytes, UA: NEGATIVE
NITRITE: NEGATIVE
Protein, ur: 30 mg/dL — AB
SPECIFIC GRAVITY, URINE: 1.032 — AB (ref 1.005–1.030)
pH: 5 (ref 5.0–8.0)

## 2017-06-19 LAB — COMPREHENSIVE METABOLIC PANEL
ALBUMIN: 4.6 g/dL (ref 3.5–5.0)
ALT: 17 U/L (ref 14–54)
AST: 25 U/L (ref 15–41)
Alkaline Phosphatase: 101 U/L (ref 38–126)
Anion gap: 7 (ref 5–15)
BILIRUBIN TOTAL: 0.7 mg/dL (ref 0.3–1.2)
BUN: 21 mg/dL — AB (ref 6–20)
CO2: 27 mmol/L (ref 22–32)
Calcium: 9.3 mg/dL (ref 8.9–10.3)
Chloride: 106 mmol/L (ref 101–111)
Creatinine, Ser: 0.99 mg/dL (ref 0.44–1.00)
GFR calc Af Amer: >60 mL/min (ref >60)
GFR calc non Af Amer: >60 mL/min (ref >60)
GLUCOSE: 89 mg/dL (ref 65–99)
Potassium: 4.2 mmol/L (ref 3.5–5.1)
Sodium: 140 mmol/L (ref 135–145)
TOTAL PROTEIN: 8.5 g/dL — AB (ref 6.5–8.1)

## 2017-06-19 LAB — CBC
HCT: 41.4 % (ref 35.0–47.0)
Hemoglobin: 13.4 g/dL (ref 12.0–16.0)
MCH: 22.9 pg — ABNORMAL LOW (ref 26.0–34.0)
MCHC: 32.3 g/dL (ref 32.0–36.0)
MCV: 70.8 fL — ABNORMAL LOW (ref 80.0–100.0)
Platelets: 301 10*3/uL (ref 150–440)
RBC: 5.85 MIL/uL — ABNORMAL HIGH (ref 3.80–5.20)
RDW: 19.5 % — AB (ref 11.5–14.5)
WBC: 7.6 10*3/uL (ref 3.6–11.0)

## 2017-06-19 LAB — LIPASE, BLOOD: Lipase: 35 U/L (ref 11–51)

## 2017-06-19 LAB — PREGNANCY, URINE: PREG TEST UR: NEGATIVE

## 2017-06-19 MED ORDER — PROMETHAZINE HCL 25 MG/ML IJ SOLN
25.0000 mg | Freq: Once | INTRAMUSCULAR | Status: AC
  Administered 2017-06-19: 25 mg via INTRAVENOUS
  Filled 2017-06-19: qty 1

## 2017-06-19 MED ORDER — PROMETHAZINE HCL 25 MG PO TABS
50.0000 mg | ORAL_TABLET | Freq: Once | ORAL | Status: AC
  Administered 2017-06-19: 50 mg via ORAL
  Filled 2017-06-19: qty 2

## 2017-06-19 MED ORDER — SODIUM CHLORIDE 0.9 % IV SOLN
Freq: Once | INTRAVENOUS | Status: AC
  Administered 2017-06-19: 17:00:00 via INTRAVENOUS

## 2017-06-19 MED ORDER — PROMETHAZINE HCL 25 MG PO TABS: 25.0000 mg | ORAL_TABLET | ORAL | 1 refills | Status: DC | PRN

## 2017-06-19 NOTE — ED Triage Notes (Signed)
Pt states that while at work today she became nauseated and started vomiting and having chills. Pt states that she has vomited about 6 times. Pt in NAD at this time.

## 2017-06-19 NOTE — ED Notes (Signed)
Pt ambulatory to POV without difficulty. VSS. NAD. Discharge instructions, RX and follow reviewed. All questions answered.

## 2017-06-19 NOTE — ED Triage Notes (Signed)
First Nurse Note: Arrives with C/O vomiting.   Patient AAOx3.  Skin warm and dry..Marland Kitchen

## 2017-06-19 NOTE — ED Provider Notes (Signed)
[facility] Emergency Department Provider Note       Time seen: ----------------------------------------- 4:09 PM on 06/19/2017 -----------------------------------------   I have reviewed the triage vital signs and the nursing notes.  HISTORY   Chief Complaint Emesis    HPI Candice Lewis is a 22 y.o. female with a history of asthma, GERD, migraines and seizures who presents to the ED for persistent vomiting today.  She is also had diarrhea as well.  She states symptoms started while she was at work, she became nauseated and started vomiting and having chills.  She states she vomited around 6 times.  She denies fever or other complaints at this time.  Past Medical History:  Diagnosis Date  . Asthma    WELL CONTROLLED-NO INHALERS CURRENTLY   . Gallstones   . GERD (gastroesophageal reflux disease)    OCC-NO MEDS  . Migraine   . Migraine   . Seizures (HCC)    POSSIBLE AT AGE 34 OR 14 X 1-PT STATES UNSURE IF IT WAS A DEFINITE SEIZURE    Patient Active Problem List   Diagnosis Date Noted  . Biliary colic   . Abdominal pain, right upper quadrant 03/09/2017  . Positive GBS test 01/09/2017  . Pregnancy 12/17/2016  . Obesity (BMI 30.0-34.9) 06/23/2016  . Spontaneous vaginal delivery 06/23/2016  . Seizures (HCC) 06/20/2016  . Asthma 01/10/2013  . Migraine with aura 01/10/2013    Past Surgical History:  Procedure Laterality Date  . CHOLECYSTECTOMY N/A 03/24/2017   Procedure: LAPAROSCOPIC CHOLECYSTECTOMY;  Surgeon: Ricarda Frame, MD;  Location: [facility] ORS;  Service: General;  Laterality: N/A;  . WISDOM TOOTH EXTRACTION      Allergies Zofran [ondansetron hcl] and Latex  Social History Social History   Tobacco Use  . Smoking status: Never Smoker  . Smokeless tobacco: Never Used  Substance Use Topics  . Alcohol use: No  . Drug use: No    Review of Systems Constitutional: Negative for fever. Cardiovascular: Negative for chest  pain. Respiratory: Negative for shortness of breath. Gastrointestinal: Negative for abdominal pain, positive for vomiting and diarrhea Genitourinary: Negative for dysuria. Musculoskeletal: Negative for back pain. Skin: Negative for rash. Neurological: Negative for headaches, focal weakness or numbness.  All systems negative/normal/unremarkable except as stated in the HPI  ____________________________________________   PHYSICAL EXAM:  VITAL SIGNS: ED Triage Vitals  Enc Vitals Group     BP 06/19/17 1533 111/74     Pulse Rate 06/19/17 1533 (!) 112     Resp 06/19/17 1533 16     Temp 06/19/17 1533 99.1 F (37.3 C)     Temp Source 06/19/17 1533 Oral     SpO2 06/19/17 1533 99 %     Weight 06/19/17 1534 192 lb (87.1 kg)     Height 06/19/17 1534 5' (1.524 m)     Head Circumference --      Peak Flow --      Pain Score --      Pain Loc --      Pain Edu? --      Excl. in GC? --     Constitutional: Alert and oriented. Well appearing and in no distress. Eyes: Conjunctivae are normal. Normal extraocular movements. ENT   Head: Normocephalic and atraumatic.   Nose: No congestion/rhinnorhea.   Mouth/Throat: Mucous membranes are moist.   Neck: No stridor. Cardiovascular: Normal rate, regular rhythm. No murmurs, rubs, or gallops. Respiratory: Normal respiratory effort without tachypnea nor retractions. Breath sounds are clear and  equal bilaterally. No wheezes/rales/rhonchi. Gastrointestinal: Soft and nontender. Normal bowel sounds Musculoskeletal: Nontender with normal range of motion in extremities. No lower extremity tenderness nor edema. Neurologic:  Normal speech and language. No gross focal neurologic deficits are appreciated.  Skin:  Skin is warm, dry and intact. No rash noted. Psychiatric: Mood and affect are normal. Speech and behavior are normal.  ____________________________________________  ED COURSE:  As part of my medical decision making, I reviewed the  following data within the electronic MEDICAL RECORD NUMBER History obtained from family if available, nursing notes, old chart and ekg, as well as notes from prior ED visits. Patient presented for symptoms of gastroenteritis, we will assess with labs and treat with oral antiemetics.   Procedures ____________________________________________   LABS (pertinent positives/negatives)  Labs Reviewed  COMPREHENSIVE METABOLIC PANEL - Abnormal; Notable for the following components:      Result Value   BUN 21 (*)    Total Protein 8.5 (*)    All other components within normal limits  CBC - Abnormal; Notable for the following components:   RBC 5.85 (*)    MCV 70.8 (*)    MCH 22.9 (*)    RDW 19.5 (*)    All other components within normal limits  URINALYSIS, COMPLETE (UACMP) WITH MICROSCOPIC - Abnormal; Notable for the following components:   Color, Urine YELLOW (*)    APPearance HAZY (*)    Specific Gravity, Urine 1.032 (*)    Ketones, ur 5 (*)    Protein, ur 30 (*)    Squamous Epithelial / LPF 0-5 (*)    All other components within normal limits  LIPASE, BLOOD  POC URINE PREG, ED  ____________________________________________  DIFFERENTIAL DIAGNOSIS   Gastroenteritis, dehydration, electrolyte abnormality  FINAL ASSESSMENT AND PLAN  Gastroenteritis   Plan: Patient had presented for vomiting and diarrhea. Patient's labs were reassuring.  She will be discharged with Phenergan and is stable for outpatient follow-up.   Emily FilbertWilliams, Ellenora Talton E, MD   Note: This note was generated in part or whole with voice recognition software. Voice recognition is usually quite accurate but there are transcription errors that can and very often do occur. I apologize for any typographical errors that were not detected and corrected.     Emily FilbertWilliams, Jessah Danser E, MD 06/19/17 [phone]

## 2017-06-23 ENCOUNTER — Emergency Department
Admission: EM | Admit: 2017-06-23 | Discharge: 2017-06-23 | Disposition: A | Discharge status: Home / Self Care | Payer: Medicaid Other | Attending: Emergency Medicine | Admitting: Emergency Medicine

## 2017-06-23 ENCOUNTER — Encounter: Payer: Self-pay | Admitting: Emergency Medicine

## 2017-06-23 ENCOUNTER — Other Ambulatory Visit: Payer: Self-pay

## 2017-06-23 DIAGNOSIS — Z79899 Other long term (current) drug therapy: Secondary | ICD-10-CM | POA: Diagnosis not present

## 2017-06-23 DIAGNOSIS — J111 Influenza due to unidentified influenza virus with other respiratory manifestations: Secondary | ICD-10-CM | POA: Diagnosis not present

## 2017-06-23 DIAGNOSIS — J45909 Unspecified asthma, uncomplicated: Secondary | ICD-10-CM | POA: Insufficient documentation

## 2017-06-23 DIAGNOSIS — R69 Illness, unspecified: Secondary | ICD-10-CM

## 2017-06-23 DIAGNOSIS — J029 Acute pharyngitis, unspecified: Secondary | ICD-10-CM | POA: Diagnosis present

## 2017-06-23 DIAGNOSIS — Z91040 Latex allergy status: Secondary | ICD-10-CM | POA: Insufficient documentation

## 2017-06-23 LAB — INFLUENZA PANEL BY PCR (TYPE A & B)
Influenza A By PCR: POSITIVE — AB
Influenza B By PCR: NEGATIVE

## 2017-06-23 MED ORDER — OSELTAMIVIR PHOSPHATE 75 MG PO CAPS: 75.0000 mg | ORAL_CAPSULE | Freq: Two times a day (BID) | ORAL | 0 refills | Status: AC

## 2017-06-23 NOTE — ED Notes (Signed)
See triage noe  States she woke up with sore throat and ear pain this am  staes she had fever of 101 at home  Low grade temp on arrival

## 2017-06-23 NOTE — ED Triage Notes (Signed)
C/O sore throat and bilateral ear pain.  Onset of symptoms this am.

## 2017-06-23 NOTE — ED Provider Notes (Signed)
[facility] Emergency Department Provider Note  ____________________________________________  Time seen: Approximately 3:30 PM  I have reviewed the triage vital signs and the nursing notes.   HISTORY  Chief Complaint Sore Throat and Otalgia    HPI Candice Lewis is a 22 y.o. female presents to the emergency department with pharyngitis, otalgia, rhinorrhea, malaise, headache and fever that started today.  Patient reports a prodrome of diarrhea and emesis.  She is tolerating fluids by mouth but has had diminished appetite.  No major changes in urinary habits.  She denies chest pain, chest tightness, nausea and abdominal pain.   Past Medical History:  Diagnosis Date  . Asthma    WELL CONTROLLED-NO INHALERS CURRENTLY   . Gallstones   . GERD (gastroesophageal reflux disease)    OCC-NO MEDS  . Migraine   . Migraine   . Seizures (HCC)    POSSIBLE AT AGE 53 OR 14 X 1-PT STATES UNSURE IF IT WAS A DEFINITE SEIZURE    Patient Active Problem List   Diagnosis Date Noted  . Biliary colic   . Abdominal pain, right upper quadrant 03/09/2017  . Positive GBS test 01/09/2017  . Pregnancy 12/17/2016  . Obesity (BMI 30.0-34.9) 06/23/2016  . Spontaneous vaginal delivery 06/23/2016  . Seizures (HCC) 06/20/2016  . Asthma 01/10/2013  . Migraine with aura 01/10/2013    Past Surgical History:  Procedure Laterality Date  . CHOLECYSTECTOMY N/A 03/24/2017   Procedure: LAPAROSCOPIC CHOLECYSTECTOMY;  Surgeon: Ricarda FrameWoodham, Charles, MD;  Location: [facility] ORS;  Service: General;  Laterality: N/A;  . WISDOM TOOTH EXTRACTION      Prior to Admission medications   Medication Sig Start Date End Date Taking? Authorizing Provider  oseltamivir (TAMIFLU) 75 MG capsule Take 1 capsule (75 mg total) by mouth 2 (two) times daily for 5 days. 06/23/17 06/28/17  Orvil FeilWoods, Jermar Colter M, PA-C  promethazine (PHENERGAN) 25 MG tablet Take 1 tablet (25 mg total) by mouth every 4 (four) hours as needed for nausea  or vomiting. 06/19/17   Emily FilbertWilliams, Jonathan E, MD    Allergies Zofran Frazier Richards[ondansetron hcl] and Latex  Family History  Problem Relation Age of Onset  . Gallstones Mother     Social History Social History   Tobacco Use  . Smoking status: Never Smoker  . Smokeless tobacco: Never Used  Substance Use Topics  . Alcohol use: No  . Drug use: No      Review of Systems  Constitutional: Patient has fever.  Eyes: No visual changes. No discharge ENT: Patient has congestion.  Cardiovascular: no chest pain. Respiratory: Patient has cough.  Gastrointestinal: No abdominal pain.    Genitourinary: Negative for dysuria. No hematuria Musculoskeletal: Patient has myalgias.  Skin: Negative for rash, abrasions, lacerations, ecchymosis. Neurological: Patient has headache, no focal weakness or numbness.     ____________________________________________   PHYSICAL EXAM:  VITAL SIGNS: ED Triage Vitals  Enc Vitals Group     BP 06/23/17 1440 131/74     Pulse Rate 06/23/17 1440 (!) 110     Resp 06/23/17 1440 18     Temp 06/23/17 1440 99.3 F (37.4 C)     Temp Source 06/23/17 1440 Oral     SpO2 06/23/17 1440 98 %     Weight 06/23/17 1441 192 lb (87.1 kg)     Height 06/23/17 1441 5' (1.524 m)     Head Circumference --      Peak Flow --      Pain Score 06/23/17 1449 9  Pain Loc --      Pain Edu? --      Excl. in GC? --      Constitutional: Alert and oriented. Patient is lying supine. Eyes: Conjunctivae are normal. PERRL. EOMI. Head: Atraumatic. ENT:      Ears: Tympanic membranes are mildly injected with mild effusion bilaterally.       Nose: No congestion/rhinnorhea.      Mouth/Throat: Mucous membranes are moist. Posterior pharynx is mildly erythematous.  Hematological/Lymphatic/Immunilogical: No cervical lymphadenopathy.  Cardiovascular: Normal rate, regular rhythm. Normal S1 and S2.  Good peripheral circulation. Respiratory: Normal respiratory effort without tachypnea or  retractions. Lungs CTAB. Good air entry to the bases with no decreased or absent breath sounds. Gastrointestinal: Bowel sounds 4 quadrants. Soft and nontender to palpation. No guarding or rigidity. No palpable masses. No distention. No CVA tenderness. Musculoskeletal: Full range of motion to all extremities. No gross deformities appreciated. Neurologic:  Normal speech and language. No gross focal neurologic deficits are appreciated.  Skin:  Skin is warm, dry and intact. No rash noted. Psychiatric: Mood and affect are normal. Speech and behavior are normal. Patient exhibits appropriate insight and judgement.    ____________________________________________   LABS (all labs ordered are listed, but only abnormal results are displayed)  Labs Reviewed  INFLUENZA PANEL BY PCR (TYPE A & B)   ____________________________________________  EKG   ____________________________________________  RADIOLOGY   No results found.  ____________________________________________    PROCEDURES  Procedure(s) performed:    Procedures    Medications - No data to display   ____________________________________________   INITIAL IMPRESSION / ASSESSMENT AND PLAN / ED COURSE  Pertinent labs & imaging results that were available during my care of the patient were reviewed by me and considered in my medical decision making (see chart for details).  Review of the  CSRS was performed in accordance of the [facility] prior to dispensing any controlled drugs.    Assessment and plan Differential diagnosis includes influenza versus unspecified viral URI.  Patient reports headache, rhinorrhea, congestion, body aches and a prodrome of diarrhea and emesis.  History and physical exam findings are consistent with influenza.  Patient was treated empirically with Tamiflu and advised to follow-up with primary care as needed.  All patient questions were  answered.   ____________________________________________  FINAL CLINICAL IMPRESSION(S) / ED DIAGNOSES  Final diagnoses:  Influenza-like illness      NEW MEDICATIONS STARTED DURING THIS VISIT:  ED Discharge Orders        Ordered    oseltamivir (TAMIFLU) 75 MG capsule  2 times daily     06/23/17 1526          This chart was dictated using voice recognition software/Dragon. Despite best efforts to proofread, errors can occur which can change the meaning. Any change was purely unintentional.    Orvil Feil, PA-C 06/23/17 1533    Dionne Bucy, MD 06/23/17 2024

## 2017-08-15 ENCOUNTER — Emergency Department: Payer: Medicaid Other

## 2017-08-15 ENCOUNTER — Other Ambulatory Visit: Payer: Self-pay

## 2017-08-15 ENCOUNTER — Emergency Department
Admission: EM | Admit: 2017-08-15 | Discharge: 2017-08-15 | Disposition: A | Discharge status: Home / Self Care | Payer: Medicaid Other | Attending: Emergency Medicine | Admitting: Emergency Medicine

## 2017-08-15 ENCOUNTER — Encounter: Payer: Self-pay | Admitting: *Deleted

## 2017-08-15 DIAGNOSIS — Z79899 Other long term (current) drug therapy: Secondary | ICD-10-CM | POA: Diagnosis not present

## 2017-08-15 DIAGNOSIS — Z91040 Latex allergy status: Secondary | ICD-10-CM | POA: Diagnosis not present

## 2017-08-15 DIAGNOSIS — J45909 Unspecified asthma, uncomplicated: Secondary | ICD-10-CM | POA: Insufficient documentation

## 2017-08-15 DIAGNOSIS — J069 Acute upper respiratory infection, unspecified: Secondary | ICD-10-CM

## 2017-08-15 DIAGNOSIS — R509 Fever, unspecified: Secondary | ICD-10-CM | POA: Diagnosis present

## 2017-08-15 LAB — GROUP A STREP BY PCR: Group A Strep by PCR: NOT DETECTED

## 2017-08-15 LAB — INFLUENZA PANEL BY PCR (TYPE A & B)
INFLBPCR: NEGATIVE
Influenza A By PCR: NEGATIVE

## 2017-08-15 MED ORDER — FLUTICASONE PROPIONATE 50 MCG/ACT NA SUSP: 1.0000 | Freq: Two times a day (BID) | NASAL | 0 refills | Status: DC

## 2017-08-15 MED ORDER — PSEUDOEPH-BROMPHEN-DM 30-2-10 MG/5ML PO SYRP: 10.0000 mL | ORAL_SOLUTION | Freq: Four times a day (QID) | ORAL | 0 refills | Status: DC | PRN

## 2017-08-15 MED ORDER — ACETAMINOPHEN 325 MG PO TABS
ORAL_TABLET | ORAL | Status: AC
  Filled 2017-08-15: qty 2

## 2017-08-15 MED ORDER — MAGIC MOUTHWASH W/LIDOCAINE: 5.0000 mL | Freq: Four times a day (QID) | ORAL | 0 refills | Status: DC

## 2017-08-15 MED ORDER — ACETAMINOPHEN 325 MG PO TABS
650.0000 mg | ORAL_TABLET | Freq: Once | ORAL | Status: AC | PRN
  Administered 2017-08-15: 650 mg via ORAL

## 2017-08-15 NOTE — ED Triage Notes (Signed)
Pt to Ed reporting generalized body aches for the past three days with a fever and productive cough. Pt reports having taken tylenol at 1230 this afternoon. No vomiting or diarrhea reported. Pt verbalized she had the flu in January of this year.

## 2017-08-15 NOTE — ED Provider Notes (Signed)
[facility] Emergency Department Provider Note  ____________________________________________  Time seen: Approximately 8:26 PM  I have reviewed the triage vital signs and the nursing notes.   HISTORY  Chief Complaint Generalized Body Aches; Fever; and Cough    HPI Candice Lewis is a 22 y.o. female who presents emergency department complaining of fevers, chills, nasal congestion, body aches, cough, sore throat.  Patient denies any headache, visual changes, neck pain or stiffness, chest pain, shortness of breath, abdominal pain, nausea vomiting, diarrhea or constipation.  Patient states that she had influenza a month and a half ago and states that symptoms are consistent with previous influenza.  Patient tried NyQuil with no relief.  Patient not had not had any antipyretics prior to arrival.  Patient states that she is able to maintain fluid intake but does not have much of an appetite.  No other complaints at this time.  No other medications prior to arrival.  Patient has a history of asthma, cholelithiasis, GERD, migraine headaches.  Patient denies any complaints of chronic nature.  Past Medical History:  Diagnosis Date  . Asthma    WELL CONTROLLED-NO INHALERS CURRENTLY   . Gallstones   . GERD (gastroesophageal reflux disease)    OCC-NO MEDS  . Migraine   . Migraine   . Seizures (HCC)    POSSIBLE AT AGE 69 OR 14 X 1-PT STATES UNSURE IF IT WAS A DEFINITE SEIZURE    Patient Active Problem List   Diagnosis Date Noted  . Biliary colic   . Abdominal pain, right upper quadrant 03/09/2017  . Positive GBS test 01/09/2017  . Pregnancy 12/17/2016  . Obesity (BMI 30.0-34.9) 06/23/2016  . Spontaneous vaginal delivery 06/23/2016  . Seizures (HCC) 06/20/2016  . Asthma 01/10/2013  . Migraine with aura 01/10/2013    Past Surgical History:  Procedure Laterality Date  . CHOLECYSTECTOMY N/A 03/24/2017   Procedure: LAPAROSCOPIC CHOLECYSTECTOMY;  Surgeon: Ricarda Frame, MD;  Location: [facility] ORS;  Service: General;  Laterality: N/A;  . WISDOM TOOTH EXTRACTION      Prior to Admission medications   Medication Sig Start Date End Date Taking? Authorizing Provider  brompheniramine-pseudoephedrine-DM 30-2-10 MG/5ML syrup Take 10 mLs by mouth 4 (four) times daily as needed. 08/15/17   Owen Pagnotta, Delorise Royals, PA-C  fluticasone (FLONASE) 50 MCG/ACT nasal spray Place 1 spray into both nostrils 2 (two) times daily. 08/15/17   Graciemae Delisle, Delorise Royals, PA-C  magic mouthwash w/lidocaine SOLN Take 5 mLs by mouth 4 (four) times daily. 08/15/17   Ellamarie Naeve, Delorise Royals, PA-C  promethazine (PHENERGAN) 25 MG tablet Take 1 tablet (25 mg total) by mouth every 4 (four) hours as needed for nausea or vomiting. 06/19/17   Emily Filbert, MD    Allergies Zofran Frazier Richards hcl] and Latex  Family History  Problem Relation Age of Onset  . Gallstones Mother     Social History Social History   Tobacco Use  . Smoking status: Never Smoker  . Smokeless tobacco: Never Used  Substance Use Topics  . Alcohol use: No  . Drug use: No     Review of Systems  Constitutional: Positive fever/chills Eyes: No visual changes. No discharge ENT: Positive for nasal congestion and sore throat Cardiovascular: no chest pain. Respiratory: Positive cough. No SOB. Gastrointestinal: No abdominal pain.  No nausea, no vomiting.  No diarrhea.  No constipation. Genitourinary: Negative for dysuria. No hematuria Musculoskeletal: Negative for musculoskeletal pain. Skin: Negative for rash, abrasions, lacerations, ecchymosis. Neurological: Negative for headaches, focal  weakness or numbness. 10-point ROS otherwise negative.  ____________________________________________   PHYSICAL EXAM:  VITAL SIGNS: ED Triage Vitals  Enc Vitals Group     BP 08/15/17 1747 105/81     Pulse Rate 08/15/17 1747 (!) 120     Resp 08/15/17 1747 16     Temp 08/15/17 1747 (!) 103.1 F (39.5 C)     Temp Source  08/15/17 1747 Oral     SpO2 08/15/17 1747 99 %     Weight 08/15/17 1748 200 lb (90.7 kg)     Height 08/15/17 1748 5' (1.524 m)     Head Circumference --      Peak Flow --      Pain Score 08/15/17 1748 9     Pain Loc --      Pain Edu? --      Excl. in GC? --      Constitutional: Alert and oriented. Well appearing and in no acute distress. Eyes: Conjunctivae are normal. PERRL. EOMI. Head: Atraumatic. ENT:      Ears: EACs and TMs unremarkable bilaterally.      Nose: Moderate clear congestion/rhinnorhea.      Mouth/Throat: Mucous membranes are moist.  Oropharynx is mildly erythematous but nonedematous.  Tonsils are mildly erythematous but nonedematous, no exudates.  Uvula is midline. Neck: No stridor.  Neck is supple full range of motion Hematological/Lymphatic/Immunilogical: Diffuse, mobile, slightly tender anterior cervical lymphadenopathy. Cardiovascular: Normal rate, regular rhythm. Normal S1 and S2.  Good peripheral circulation. Respiratory: Normal respiratory effort without tachypnea or retractions. Lungs CTAB. Good air entry to the bases with no decreased or absent breath sounds. Gastrointestinal: Bowel sounds 4 quadrants. Soft and nontender to palpation. No guarding or rigidity. No palpable masses. No distention.  Musculoskeletal: Full range of motion to all extremities. No gross deformities appreciated. Neurologic:  Normal speech and language. No gross focal neurologic deficits are appreciated.  Skin:  Skin is warm, dry and intact. No rash noted. Psychiatric: Mood and affect are normal. Speech and behavior are normal. Patient exhibits appropriate insight and judgement.   ____________________________________________   LABS (all labs ordered are listed, but only abnormal results are displayed)  Labs Reviewed  GROUP A STREP BY PCR  INFLUENZA PANEL BY PCR (TYPE A & B)    ____________________________________________  EKG   ____________________________________________  RADIOLOGY Festus Barren Aylin Rhoads, personally viewed and evaluated these images (plain radiographs) as part of my medical decision making, as well as reviewing the written report by the radiologist.  I concur with radiologist finding of no infiltrates consistent with pneumonia.  No pulmonary edema.  Dg Chest 2 View  Result Date: 08/15/2017 CLINICAL DATA:  Body aches for 3 days.  Fever and cough. EXAM: CHEST - 2 VIEW COMPARISON:  November 27, 2015 FINDINGS: The heart size and mediastinal contours are within normal limits. Both lungs are clear. The visualized skeletal structures are unremarkable. IMPRESSION: No active cardiopulmonary disease. Electronically Signed   By: Gerome Sam III M.D   On: 08/15/2017 19:27    ____________________________________________    PROCEDURES  Procedure(s) performed:    Procedures    Medications  acetaminophen (TYLENOL) tablet 650 mg (650 mg Oral Given 08/15/17 1752)     ____________________________________________   INITIAL IMPRESSION / ASSESSMENT AND PLAN / ED COURSE  Pertinent labs & imaging results that were available during my care of the patient were reviewed by me and considered in my medical decision making (see chart for details).  Review of the [place] CSRS  was performed in accordance of the [facility] prior to dispensing any controlled drugs.     Patient's diagnosis is consistent with viral URI.  Patient presents emergency department with multiple URI symptoms.  Initial differential included influenza, otitis media, strep, bronchitis, pneumonia.  Patient is negative on testing for influenza and strep.  Chest x-ray reveals no indication of pneumonia.  No concerning meningeal signs.  Exam was other reassuring.  At this time, no indication for further labs or imaging.  Patient's temperature responded well to Tylenol in the emergency department..  Patient will be discharged home with prescriptions for Flonase, Magic mouthwash, Bromfed cough syrup.  Return precautions given to patient.. Patient is to follow up with primary care as needed or otherwise directed. Patient is given ED precautions to return to the ED for any worsening or new symptoms.     ____________________________________________  FINAL CLINICAL IMPRESSION(S) / ED DIAGNOSES  Final diagnoses:  Viral upper respiratory tract infection      NEW MEDICATIONS STARTED DURING THIS VISIT:  ED Discharge Orders        Ordered    fluticasone (FLONASE) 50 MCG/ACT nasal spray  2 times daily     08/15/17 2032    brompheniramine-pseudoephedrine-DM 30-2-10 MG/5ML syrup  4 times daily PRN     08/15/17 2032    magic mouthwash w/lidocaine SOLN  4 times daily    Note to Pharmacy:  Dispense in a 1/1/1 ratio. Use lidocaine, diphenhydramine, prednisolone   08/15/17 2032          This chart was dictated using voice recognition software/Dragon. Despite best efforts to proofread, errors can occur which can change the meaning. Any change was purely unintentional.    Racheal PatchesCuthriell, Eliel Dudding D, PA-C 08/15/17 2033    Minna AntisPaduchowski, Kevin, MD 08/15/17 2317

## 2017-08-20 ENCOUNTER — Other Ambulatory Visit: Payer: Self-pay

## 2017-08-20 ENCOUNTER — Ambulatory Visit
Admission: EM | Admit: 2017-08-20 | Discharge: 2017-08-20 | Disposition: A | Discharge status: Home / Self Care | Payer: Medicaid Other | Attending: Family Medicine | Admitting: Family Medicine

## 2017-08-20 DIAGNOSIS — H1032 Unspecified acute conjunctivitis, left eye: Secondary | ICD-10-CM

## 2017-08-20 MED ORDER — POLYMYXIN B-TRIMETHOPRIM 10000-0.1 UNIT/ML-% OP SOLN: 1.0000 [drp] | Freq: Four times a day (QID) | OPHTHALMIC | 0 refills | Status: AC

## 2017-08-20 NOTE — ED Provider Notes (Signed)
[facility]    CSN: [id number] Arrival date & time: 08/20/17  [id number]  History   Chief Complaint Chief Complaint  Patient presents with  . Eye Problem   HPI  22 year old female presents with complaints of conjunctivitis.  Patient states that she had a 2-3-day history of redness.  She reports that her eyes are matted shut when she wakes up.  Her child has the same problem currently.  No known exacerbating/relieving factors.  No visual disturbance.  No other associated symptoms.  No other complaints.  Past Medical History:  Diagnosis Date  . Asthma    WELL CONTROLLED-NO INHALERS CURRENTLY   . Gallstones   . GERD (gastroesophageal reflux disease)    OCC-NO MEDS  . Migraine   . Migraine   . Seizures (HCC)    POSSIBLE AT AGE 35 OR 14 X 1-PT STATES UNSURE IF IT WAS A DEFINITE SEIZURE   Patient Active Problem List   Diagnosis Date Noted  . Biliary colic   . Abdominal pain, right upper quadrant 03/09/2017  . Positive GBS test 01/09/2017  . Pregnancy 12/17/2016  . Obesity (BMI 30.0-34.9) 06/23/2016  . Spontaneous vaginal delivery 06/23/2016  . Seizures (HCC) 06/20/2016  . Asthma 01/10/2013  . Migraine with aura 01/10/2013   Past Surgical History:  Procedure Laterality Date  . CHOLECYSTECTOMY N/A 03/24/2017   Procedure: LAPAROSCOPIC CHOLECYSTECTOMY;  Surgeon: Ricarda FrameWoodham, Charles, MD;  Location: [facility] ORS;  Service: General;  Laterality: N/A;  . WISDOM TOOTH EXTRACTION     OB History    Gravida  1   Para      Term      Preterm      AB      Living        SAB      TAB      Ectopic      Multiple      Live Births             Home Medications    Prior to Admission medications   Medication Sig Start Date End Date Taking? Authorizing Provider  trimethoprim-polymyxin b (POLYTRIM) ophthalmic solution Place 1 drop into the left eye every 6 (six) hours for 5 days. 08/20/17 08/25/17  Tommie Samsook, Aerika Groll G, DO   Family History Family History  Problem Relation Age  of Onset  . Gallstones Mother    Social History Social History   Tobacco Use  . Smoking status: Never Smoker  . Smokeless tobacco: Never Used  Substance Use Topics  . Alcohol use: No  . Drug use: No   Allergies   Zofran [ondansetron hcl] and Latex   Review of Systems Review of Systems  Constitutional: Negative.   Eyes: Positive for discharge and redness. Negative for visual disturbance.   Physical Exam Triage Vital Signs ED Triage Vitals  Enc Vitals Group     BP 08/20/17 0953 127/75     Pulse Rate 08/20/17 0953 88     Resp 08/20/17 0953 18     Temp 08/20/17 0953 98.4 F (36.9 C)     Temp Source 08/20/17 0953 Oral     SpO2 08/20/17 0953 100 %     Weight 08/20/17 0957 200 lb (90.7 kg)     Height 08/20/17 0957 5' (1.524 m)     Head Circumference --      Peak Flow --      Pain Score 08/20/17 0956 0     Pain Loc --  Pain Edu? --      Excl. in GC? --    Updated Vital Signs BP 127/75 (BP Location: Right Arm)   Pulse 88   Temp 98.4 F (36.9 C) (Oral)   Resp 18   Ht 5' (1.524 m)   Wt 200 lb (90.7 kg)   LMP 06/28/2017   SpO2 100%   BMI 39.06 kg/m   Physical Exam  Constitutional: She appears well-developed. No distress.  HENT:  Head: Normocephalic and atraumatic.  Eyes: Pupils are equal, round, and reactive to light.  Left eye with conjunctival injection. No drainage.  Cardiovascular: Normal rate and regular rhythm.  Pulmonary/Chest: Effort normal and breath sounds normal. She has no wheezes. She has no rales.  Neurological: She is alert.  Skin: Skin is warm. No rash noted.  Psychiatric: Her behavior is normal.  Flat affect.  Nursing note and vitals reviewed.  UC Treatments / Results  Labs (all labs ordered are listed, but only abnormal results are displayed) Labs Reviewed - No data to display  EKG None Radiology No results found.  Procedures Procedures (including critical care time)  Medications Ordered in UC Medications - No data to  display   Initial Impression / Assessment and Plan / UC Course  I have reviewed the triage vital signs and the nursing notes.  Pertinent labs & imaging results that were available during my care of the patient were reviewed by me and considered in my medical decision making (see chart for details).     22 year old female presents with conjunctivitis.  Treating with Polytrim.  Final Clinical Impressions(s) / UC Diagnoses   Final diagnoses:  Acute conjunctivitis of left eye, unspecified acute conjunctivitis type    ED Discharge Orders        Ordered    trimethoprim-polymyxin b (POLYTRIM) ophthalmic solution  Every 6 hours     08/20/17 1129     Controlled Substance Prescriptions [place] Controlled Substance Registry consulted? Not Applicable   Tommie Sams, DO 08/20/17 1207

## 2017-08-20 NOTE — ED Triage Notes (Signed)
Pt with 2 days of eye redness and itching. Matting in the morning when she wakes.

## 2017-09-09 ENCOUNTER — Encounter: Payer: Self-pay | Admitting: Emergency Medicine

## 2017-09-09 ENCOUNTER — Other Ambulatory Visit: Payer: Self-pay

## 2017-09-09 ENCOUNTER — Ambulatory Visit
Admission: EM | Admit: 2017-09-09 | Discharge: 2017-09-09 | Disposition: A | Discharge status: Home / Self Care | Payer: Medicaid Other | Attending: Family Medicine | Admitting: Family Medicine

## 2017-09-09 DIAGNOSIS — R42 Dizziness and giddiness: Secondary | ICD-10-CM | POA: Diagnosis not present

## 2017-09-09 DIAGNOSIS — N939 Abnormal uterine and vaginal bleeding, unspecified: Secondary | ICD-10-CM

## 2017-09-09 LAB — CBC
HCT: 31.7 % — ABNORMAL LOW (ref 35.0–47.0)
HEMOGLOBIN: 10 g/dL — AB (ref 12.0–16.0)
MCH: 22.5 pg — ABNORMAL LOW (ref 26.0–34.0)
MCHC: 31.5 g/dL — ABNORMAL LOW (ref 32.0–36.0)
MCV: 71.2 fL — ABNORMAL LOW (ref 80.0–100.0)
PLATELETS: 344 10*3/uL (ref 150–440)
RBC: 4.45 MIL/uL (ref 3.80–5.20)
RDW: 17.3 % — ABNORMAL HIGH (ref 11.5–14.5)
WBC: 6.7 10*3/uL (ref 3.6–11.0)

## 2017-09-09 MED ORDER — MEGESTROL ACETATE 40 MG PO TABS: 40.0000 mg | ORAL_TABLET | Freq: Two times a day (BID) | ORAL | 0 refills | Status: DC

## 2017-09-09 NOTE — ED Provider Notes (Signed)
[facility]  CSN: [id number] Arrival date & time: 09/09/17  1252   History   Chief Complaint Chief Complaint  Patient presents with  . Abdominal Pain  . Vaginal Bleeding   HPI  22 year old female presents with heavy vaginal bleeding.  Patient states she has had vaginal bleeding since February 3.  Has continued to persist.  Has not subsided.  She is on no birth control.  She reports that she is now having heavy bleeding with the passage of large clots.  She states that she is using 15-20 large pads in a 24-hour period.  She reports that she is having lower abdominal pain and cramping as well as back pain.  No relieving factors.  Additionally, patient states that she is now getting lightheaded and this is quite concerning for her.  No fevers or chills.  No other associated symptoms.  No other complaints or concerns at this time.  Past Medical History:  Diagnosis Date  . Asthma    WELL CONTROLLED-NO INHALERS CURRENTLY   . Gallstones   . GERD (gastroesophageal reflux disease)    OCC-NO MEDS  . Migraine   . Migraine   . Seizures (HCC)    POSSIBLE AT AGE 67 OR 14 X 1-PT STATES UNSURE IF IT WAS A DEFINITE SEIZURE    Patient Active Problem List   Diagnosis Date Noted  . Biliary colic   . Abdominal pain, right upper quadrant 03/09/2017  . Positive GBS test 01/09/2017  . Pregnancy 12/17/2016  . Obesity (BMI 30.0-34.9) 06/23/2016  . Spontaneous vaginal delivery 06/23/2016  . Seizures (HCC) 06/20/2016  . Asthma 01/10/2013  . Migraine with aura 01/10/2013    Past Surgical History:  Procedure Laterality Date  . CHOLECYSTECTOMY N/A 03/24/2017   Procedure: LAPAROSCOPIC CHOLECYSTECTOMY;  Surgeon: Ricarda Frame, MD;  Location: [facility] ORS;  Service: General;  Laterality: N/A;  . WISDOM TOOTH EXTRACTION      OB History    Gravida  1   Para      Term      Preterm      AB      Living        SAB      TAB      Ectopic      Multiple      Live Births              Home Medications    Prior to Admission medications   Medication Sig Start Date End Date Taking? Authorizing Provider  megestrol (MEGACE) 40 MG tablet Take 1 tablet (40 mg total) by mouth 2 (two) times daily. 09/09/17   Tommie Sams, DO    Family History Family History  Problem Relation Age of Onset  . Gallstones Mother     Social History Social History   Tobacco Use  . Smoking status: Never Smoker  . Smokeless tobacco: Never Used  Substance Use Topics  . Alcohol use: No  . Drug use: No     Allergies   Zofran [ondansetron hcl] and Latex   Review of Systems Review of Systems  Constitutional: Negative.   Gastrointestinal: Positive for abdominal pain.  Genitourinary:       Heavy vaginal bleeding.  Musculoskeletal: Positive for back pain.  Neurological: Positive for light-headedness.   Physical Exam Triage Vital Signs ED Triage Vitals  Enc Vitals Group     BP 09/09/17 1304 102/66     Pulse Rate 09/09/17 1304 85     Resp 09/09/17  1304 14     Temp 09/09/17 1304 98.5 F (36.9 C)     Temp Source 09/09/17 1304 Oral     SpO2 09/09/17 1304 100 %     Weight 09/09/17 1302 200 lb (90.7 kg)     Height 09/09/17 1302 5' (1.524 m)     Head Circumference --      Peak Flow --      Pain Score 09/09/17 1302 8     Pain Loc --      Pain Edu? --      Excl. in GC? --    Updated Vital Signs BP 102/66 (BP Location: Left Arm)   Pulse 85   Temp 98.5 F (36.9 C) (Oral)   Resp 14   Ht 5' (1.524 m)   Wt 200 lb (90.7 kg)   LMP 06/28/2017 (Exact Date)   SpO2 100%   BMI 39.06 kg/m     Physical Exam  Constitutional: She is oriented to person, place, and time. She appears well-developed. No distress.  Cardiovascular: Normal rate and regular rhythm.  Pulmonary/Chest: Effort normal and breath sounds normal. She has no wheezes. She has no rales.  Abdominal: Soft.  Tender to palpation in the lower abdomen.  Neurological: She is alert and oriented to person, place, and  time.  Psychiatric: Her behavior is normal.  Flat affect.  Nursing note and vitals reviewed.  UC Treatments / Results  Labs (all labs ordered are listed, but only abnormal results are displayed) Labs Reviewed  CBC - Abnormal; Notable for the following components:      Result Value   Hemoglobin 10.0 (*)    HCT 31.7 (*)    MCV 71.2 (*)    MCH 22.5 (*)    MCHC 31.5 (*)    RDW 17.3 (*)    All other components within normal limits    EKG None Radiology No results found.  Procedures Procedures (including critical care time)  Medications Ordered in UC Medications - No data to display   Initial Impression / Assessment and Plan / UC Course  I have reviewed the triage vital signs and the nursing notes.  Pertinent labs & imaging results that were available during my care of the patient were reviewed by me and considered in my medical decision making (see chart for details).    22 year old female presents with heavy vaginal bleeding.  Hemoglobin is 10 today.  No indication for transfusion.  Placing on Megace.  Advised to call OB/GYN to follow-up and discuss further treatment options.  Final Clinical Impressions(s) / UC Diagnoses   Final diagnoses:  Abnormal vaginal bleeding    ED Discharge Orders        Ordered    megestrol (MEGACE) 40 MG tablet  2 times daily     09/09/17 1358     Controlled Substance Prescriptions [place] Controlled Substance Registry consulted? Not Applicable   Tommie SamsCook, Neno Hohensee G, DO 09/09/17 1402

## 2017-09-09 NOTE — Discharge Instructions (Signed)
[facility].  Medication as prescribed.  Take care  Dr. Adriana Simasook

## 2017-09-09 NOTE — ED Triage Notes (Signed)
Patient states that she has been having vaginal bleeding and lower abdominal pain since Feb. 3.  Patient states that when she stands up lots of blood will come out.

## 2018-02-16 ENCOUNTER — Other Ambulatory Visit: Payer: Self-pay

## 2018-02-16 ENCOUNTER — Emergency Department
Admission: EM | Admit: 2018-02-16 | Discharge: 2018-02-16 | Disposition: A | Discharge status: Home / Self Care | Payer: Medicaid Other | Attending: Emergency Medicine | Admitting: Emergency Medicine

## 2018-02-16 ENCOUNTER — Encounter: Payer: Self-pay | Admitting: Emergency Medicine

## 2018-02-16 DIAGNOSIS — J038 Acute tonsillitis due to other specified organisms: Secondary | ICD-10-CM | POA: Insufficient documentation

## 2018-02-16 DIAGNOSIS — Z79899 Other long term (current) drug therapy: Secondary | ICD-10-CM | POA: Insufficient documentation

## 2018-02-16 DIAGNOSIS — B9789 Other viral agents as the cause of diseases classified elsewhere: Secondary | ICD-10-CM | POA: Insufficient documentation

## 2018-02-16 DIAGNOSIS — J45909 Unspecified asthma, uncomplicated: Secondary | ICD-10-CM | POA: Insufficient documentation

## 2018-02-16 LAB — GROUP A STREP BY PCR: Group A Strep by PCR: NOT DETECTED

## 2018-02-16 MED ORDER — DEXAMETHASONE SODIUM PHOSPHATE 10 MG/ML IJ SOLN
10.0000 mg | Freq: Once | INTRAMUSCULAR | Status: AC
  Administered 2018-02-16: 10 mg via INTRAMUSCULAR
  Filled 2018-02-16: qty 1

## 2018-02-16 MED ORDER — LIDOCAINE VISCOUS HCL 2 % MT SOLN: 10.0000 mL | OROMUCOSAL | 0 refills | Status: DC | PRN

## 2018-02-16 NOTE — ED Notes (Signed)
First Nurse Note: Patient states "my tonsils are swollen really bad", alert and oriented.  Speech clear, handling secretions.  NAD.  Speaking in full sentences.

## 2018-02-16 NOTE — ED Provider Notes (Signed)
[facility] Emergency Department Provider Note  ____________________________________________  Time seen: Approximately 8:32 AM  I have reviewed the triage vital signs and the nursing notes.   HISTORY  Chief Complaint Sore Throat    HPI Candice Lewis is a 22 y.o. female that presents to the emergency department for evaluation of sore throat for 1 day.  Patient states that she had a tonsil stone yesterday.  She is unsure of fever.  No sick contacts.  No nasal congestion, cough, shortness of breath.  Past Medical History:  Diagnosis Date  . Asthma    WELL CONTROLLED-NO INHALERS CURRENTLY   . Gallstones   . GERD (gastroesophageal reflux disease)    OCC-NO MEDS  . Migraine   . Migraine   . Seizures (HCC)    POSSIBLE AT AGE 48 OR 14 X 1-PT STATES UNSURE IF IT WAS A DEFINITE SEIZURE    Patient Active Problem List   Diagnosis Date Noted  . Biliary colic   . Abdominal pain, right upper quadrant 03/09/2017  . Positive GBS test 01/09/2017  . Pregnancy 12/17/2016  . Obesity (BMI 30.0-34.9) 06/23/2016  . Spontaneous vaginal delivery 06/23/2016  . Seizures (HCC) 06/20/2016  . Asthma 01/10/2013  . Migraine with aura 01/10/2013    Past Surgical History:  Procedure Laterality Date  . CHOLECYSTECTOMY N/A 03/24/2017   Procedure: LAPAROSCOPIC CHOLECYSTECTOMY;  Surgeon: Ricarda FrameWoodham, Charles, MD;  Location: [facility] ORS;  Service: General;  Laterality: N/A;  . WISDOM TOOTH EXTRACTION      Prior to Admission medications   Medication Sig Start Date End Date Taking? Authorizing Provider  lidocaine (XYLOCAINE) 2 % solution Use as directed 10 mLs in the mouth or throat as needed for mouth pain. 02/16/18   Enid DerryWagner, Betzaida Cremeens, PA-C  megestrol (MEGACE) 40 MG tablet Take 1 tablet (40 mg total) by mouth 2 (two) times daily. 09/09/17   Tommie Samsook, Jayce G, DO    Allergies Zofran Frazier Richards[ondansetron hcl] and Latex  Family History  Problem Relation Age of Onset  . Gallstones Mother      Social History Social History   Tobacco Use  . Smoking status: Never Smoker  . Smokeless tobacco: Never Used  Substance Use Topics  . Alcohol use: No  . Drug use: No     Review of Systems  Constitutional: No fever/chills Eyes: No visual changes. No discharge. ENT: Negative for congestion and rhinorrhea. Cardiovascular: No chest pain. Respiratory: Negative for cough. No SOB. Gastrointestinal: No abdominal pain.  No nausea, no vomiting.   Musculoskeletal: Negative for musculoskeletal pain. Skin: Negative for rash, abrasions, lacerations, ecchymosis.   ____________________________________________   PHYSICAL EXAM:  VITAL SIGNS: ED Triage Vitals  Enc Vitals Group     BP 02/16/18 0823 119/68     Pulse Rate 02/16/18 0823 68     Resp 02/16/18 0823 16     Temp 02/16/18 0823 98.3 F (36.8 C)     Temp Source 02/16/18 0823 Oral     SpO2 02/16/18 0823 99 %     Weight 02/16/18 0824 200 lb (90.7 kg)     Height 02/16/18 0824 5' (1.524 m)     Head Circumference --      Peak Flow --      Pain Score 02/16/18 0823 9     Pain Loc --      Pain Edu? --      Excl. in GC? --      Constitutional: Alert and oriented. Well appearing and in no  acute distress. Eyes: Conjunctivae are normal. PERRL. EOMI. No discharge. Head: Atraumatic. ENT: No frontal and maxillary sinus tenderness.      Ears: Tympanic membranes pearly gray with good landmarks. No discharge.      Nose: No congestion/rhinnorhea.      Mouth/Throat: Mucous membranes are moist. Oropharynx erythematous. Tonsils not enlarged. No exudates. Uvula midline. Neck: No stridor.   Hematological/Lymphatic/Immunilogical: No cervical lymphadenopathy. Cardiovascular: Normal rate, regular rhythm.  Good peripheral circulation. Respiratory: Normal respiratory effort without tachypnea or retractions. Lungs CTAB. Good air entry to the bases with no decreased or absent breath sounds. Gastrointestinal: Bowel sounds 4 quadrants. Soft and  nontender to palpation. No guarding or rigidity. No palpable masses. No distention. Musculoskeletal: Full range of motion to all extremities. No gross deformities appreciated. Neurologic:  Normal speech and language. No gross focal neurologic deficits are appreciated.  Skin:  Skin is warm, dry and intact. No rash noted. Psychiatric: Mood and affect are normal. Speech and behavior are normal. Patient exhibits appropriate insight and judgement.   ____________________________________________   LABS (all labs ordered are listed, but only abnormal results are displayed)  Labs Reviewed  GROUP A STREP BY PCR   ____________________________________________  EKG   ____________________________________________  RADIOLOGY   No results found.  ____________________________________________    PROCEDURES  Procedure(s) performed:    Procedures    Medications  dexamethasone (DECADRON) injection 10 mg (10 mg Intramuscular Given 02/16/18 0911)     ____________________________________________   INITIAL IMPRESSION / ASSESSMENT AND PLAN / ED COURSE  Pertinent labs & imaging results that were available during my care of the patient were reviewed by me and considered in my medical decision making (see chart for details).  Review of the [place] CSRS was performed in accordance of the [facility] prior to dispensing any controlled drugs.     Patient's diagnosis is consistent with viral tonsillitis. Vital signs and exam are reassuring.  Strep test is negative.  Patient appears well and is staying well hydrated. Patient should alternate tylenol and ibuprofen for fever. Patient feels comfortable going home.  IM Decadron was given.  Patient will be discharged home with prescriptions for viscous lidocaine. Patient is to follow up with pediatrician as needed or otherwise directed. Patient is given ED precautions to return to the ED for any worsening or new  symptoms.     ____________________________________________  FINAL CLINICAL IMPRESSION(S) / ED DIAGNOSES  Final diagnoses:  Viral tonsillitis      NEW MEDICATIONS STARTED DURING THIS VISIT:  ED Discharge Orders         Ordered    lidocaine (XYLOCAINE) 2 % solution  As needed     02/16/18 1003              This chart was dictated using voice recognition software/Dragon. Despite best efforts to proofread, errors can occur which can change the meaning. Any change was purely unintentional.    Enid Derry, PA-C 02/16/18 1320    Jene Every, MD 02/16/18 1455

## 2018-02-16 NOTE — ED Notes (Signed)
See triage note  Presents with sore throat  States she feels like her tonsils are swollen  Hx of same in past   sxs' started yesterday  No fever  Able to swallow well

## 2018-02-16 NOTE — ED Triage Notes (Signed)
Patient complaining of sore throat with swollen tonsills starting yesterday PM.  States she has episodes of tonsillar swelling 2-3X a year.  Speech is clear, handling secretions.  NAD.  Denies known exposure to anyone with similar sx.  Temp 98.3

## 2018-04-04 ENCOUNTER — Emergency Department: Payer: Self-pay

## 2018-04-04 ENCOUNTER — Emergency Department
Admission: EM | Admit: 2018-04-04 | Discharge: 2018-04-04 | Disposition: A | Discharge status: Home / Self Care | Payer: Self-pay | Attending: Emergency Medicine | Admitting: Emergency Medicine

## 2018-04-04 ENCOUNTER — Other Ambulatory Visit: Payer: Self-pay

## 2018-04-04 ENCOUNTER — Encounter: Payer: Self-pay | Admitting: *Deleted

## 2018-04-04 DIAGNOSIS — J45909 Unspecified asthma, uncomplicated: Secondary | ICD-10-CM | POA: Insufficient documentation

## 2018-04-04 DIAGNOSIS — Z91040 Latex allergy status: Secondary | ICD-10-CM | POA: Insufficient documentation

## 2018-04-04 DIAGNOSIS — J111 Influenza due to unidentified influenza virus with other respiratory manifestations: Secondary | ICD-10-CM | POA: Insufficient documentation

## 2018-04-04 DIAGNOSIS — Z79899 Other long term (current) drug therapy: Secondary | ICD-10-CM | POA: Insufficient documentation

## 2018-04-04 DIAGNOSIS — R6889 Other general symptoms and signs: Secondary | ICD-10-CM

## 2018-04-04 DIAGNOSIS — R509 Fever, unspecified: Secondary | ICD-10-CM

## 2018-04-04 LAB — URINALYSIS, COMPLETE (UACMP) WITH MICROSCOPIC
BACTERIA UA: NONE SEEN
BILIRUBIN URINE: NEGATIVE
Glucose, UA: NEGATIVE mg/dL
HGB URINE DIPSTICK: NEGATIVE
Ketones, ur: NEGATIVE mg/dL
Leukocytes, UA: NEGATIVE
Nitrite: NEGATIVE
PROTEIN: NEGATIVE mg/dL
SPECIFIC GRAVITY, URINE: 1.008 (ref 1.005–1.030)
pH: 6 (ref 5.0–8.0)

## 2018-04-04 LAB — MONONUCLEOSIS SCREEN: Mono Screen: NEGATIVE

## 2018-04-04 LAB — INFLUENZA PANEL BY PCR (TYPE A & B)
Influenza A By PCR: NEGATIVE
Influenza B By PCR: NEGATIVE

## 2018-04-04 LAB — GROUP A STREP BY PCR: Group A Strep by PCR: NOT DETECTED

## 2018-04-04 MED ORDER — IBUPROFEN 600 MG PO TABS
600.0000 mg | ORAL_TABLET | Freq: Once | ORAL | Status: AC
  Administered 2018-04-04: 600 mg via ORAL

## 2018-04-04 MED ORDER — OSELTAMIVIR PHOSPHATE 75 MG PO CAPS: 75.0000 mg | ORAL_CAPSULE | Freq: Two times a day (BID) | ORAL | 0 refills | Status: DC

## 2018-04-04 MED ORDER — IBUPROFEN 600 MG PO TABS
ORAL_TABLET | ORAL | Status: AC
  Filled 2018-04-04: qty 1

## 2018-04-04 NOTE — ED Provider Notes (Addendum)
[facility] Emergency Department Provider Note  ____________________________________________   First MD Initiated Contact with Patient 04/04/18 2015     (approximate)  I have reviewed the triage vital signs and the nursing notes.   HISTORY  Chief Complaint Fever and Headache    HPI Candice Lewis is a 22 y.o. female presents emergency department complaining of fever and headache that started yesterday.  She has had body aches all day.  Temp was 102.5 at home.  She took Tylenol and states that helped a little bit.  She states that her throat is sore and she is had a slight cough.  She denies any vomiting or diarrhea.    Past Medical History:  Diagnosis Date  . Asthma    WELL CONTROLLED-NO INHALERS CURRENTLY   . Gallstones   . GERD (gastroesophageal reflux disease)    OCC-NO MEDS  . Migraine   . Migraine   . Seizures (HCC)    POSSIBLE AT AGE 43 OR 14 X 1-PT STATES UNSURE IF IT WAS A DEFINITE SEIZURE    Patient Active Problem List   Diagnosis Date Noted  . Biliary colic   . Abdominal pain, right upper quadrant 03/09/2017  . Positive GBS test 01/09/2017  . Pregnancy 12/17/2016  . Obesity (BMI 30.0-34.9) 06/23/2016  . Spontaneous vaginal delivery 06/23/2016  . Seizures (HCC) 06/20/2016  . Asthma 01/10/2013  . Migraine with aura 01/10/2013    Past Surgical History:  Procedure Laterality Date  . CHOLECYSTECTOMY N/A 03/24/2017   Procedure: LAPAROSCOPIC CHOLECYSTECTOMY;  Surgeon: Ricarda Frame, MD;  Location: [facility] ORS;  Service: General;  Laterality: N/A;  . WISDOM TOOTH EXTRACTION      Prior to Admission medications   Medication Sig Start Date End Date Taking? Authorizing Provider  lidocaine (XYLOCAINE) 2 % solution Use as directed 10 mLs in the mouth or throat as needed for mouth pain. 02/16/18   Enid Derry, PA-C  megestrol (MEGACE) 40 MG tablet Take 1 tablet (40 mg total) by mouth 2 (two) times daily. 09/09/17   Tommie Sams, DO    oseltamivir (TAMIFLU) 75 MG capsule Take 1 capsule (75 mg total) by mouth 2 (two) times daily. 04/04/18   Faythe Ghee, PA-C    Allergies Zofran Frazier Richards hcl] and Latex  Family History  Problem Relation Age of Onset  . Gallstones Mother     Social History Social History   Tobacco Use  . Smoking status: Never Smoker  . Smokeless tobacco: Never Used  Substance Use Topics  . Alcohol use: No  . Drug use: No    Review of Systems  Constitutional: Positive fever/chills, positive for head ache and body aches Eyes: No visual changes. ENT: Positive sore throat. Respiratory: Positive cough Genitourinary: Negative for dysuria. Musculoskeletal: Negative for back pain. Skin: Negative for rash.    ____________________________________________   PHYSICAL EXAM:  VITAL SIGNS: ED Triage Vitals  Enc Vitals Group     BP 04/04/18 2003 118/78     Pulse Rate 04/04/18 2003 (!) 117     Resp 04/04/18 2003 16     Temp 04/04/18 2003 (!) 101.4 F (38.6 C)     Temp Source 04/04/18 2003 Oral     SpO2 04/04/18 2003 97 %     Weight 04/04/18 2004 199 lb 15.3 oz (90.7 kg)     Height 04/04/18 2004 5' (1.524 m)     Head Circumference --      Peak Flow --  Pain Score 04/04/18 2004 9     Pain Loc --      Pain Edu? --      Excl. in GC? --     Constitutional: Alert and oriented. Well appearing and in no acute distress. Eyes: Conjunctivae are normal.  Head: Atraumatic. Nose: No congestion/rhinnorhea. Mouth/Throat: Mucous membranes are moist.  Throat is mildly red Neck:  supple no lymphadenopathy noted Cardiovascular: Normal rate, regular rhythm. Heart sounds are normal Respiratory: Normal respiratory effort.  No retractions, lungs c t a  Abd: soft nontender bs normal all 4 quad GU: deferred Musculoskeletal: FROM all extremities, warm and well perfused Neurologic:  Normal speech and language.  Skin:  Skin is warm, dry and intact. No rash noted. Psychiatric: Mood and affect are  normal. Speech and behavior are normal.  ____________________________________________   LABS (all labs ordered are listed, but only abnormal results are displayed)  Labs Reviewed  URINALYSIS, COMPLETE (UACMP) WITH MICROSCOPIC - Abnormal; Notable for the following components:      Result Value   Color, Urine STRAW (*)    APPearance CLEAR (*)    All other components within normal limits  GROUP A STREP BY PCR  MONONUCLEOSIS SCREEN  INFLUENZA PANEL BY PCR (TYPE A & B)   ____________________________________________   ____________________________________________  RADIOLOGY  Chest x-ray is negative  ____________________________________________   PROCEDURES  Procedure(s) performed: No  Procedures    ____________________________________________   INITIAL IMPRESSION / ASSESSMENT AND PLAN / ED COURSE  Pertinent labs & imaging results that were available during my care of the patient were reviewed by me and considered in my medical decision making (see chart for details).   Patient is 22 year old female presents emergency department complaining of flulike symptoms.  On physical exam patient is febrile.  Throat is mildly red.  She has dry cough.  Remainder the exam is unremarkable.  Strep test, flu test, mono ordered.  Strep test, flu test, mono test, UA, and chest x-ray are all negative for any signs of infection.  Discussed findings with patient.  Told her this most likely is a viral illness.  She was offered Tamiflu.  Instructed her to take the medication within the first 24 to 48 hours of fever.  Do to the fact that she is self-pay, explained to her that the medication would not cure her and would only help with symptoms.  She states she understands and was discharged in stable condition in the care of her friend.    As part of my medical decision making, I reviewed the following data within the electronic MEDICAL RECORD NUMBER Nursing notes reviewed and incorporated, Labs  reviewed UA is negative, strep, mono, influenza test are all negative, Old chart reviewed, Radiograph reviewed chest x-ray is negative for pneumonia, Notes from prior ED visits and [place] Controlled Substance Database  ____________________________________________   FINAL CLINICAL IMPRESSION(S) / ED DIAGNOSES  Final diagnoses:  Fever and chills  Flu-like symptoms      NEW MEDICATIONS STARTED DURING THIS VISIT:  Discharge Medication List as of 04/04/2018 10:26 PM    START taking these medications   Details  oseltamivir (TAMIFLU) 75 MG capsule Take 1 capsule (75 mg total) by mouth 2 (two) times daily., Starting Sun 04/04/2018, Print         Note:  This document was prepared using Dragon voice recognition software and may include unintentional dictation errors.    Faythe Ghee, PA-C 04/04/18 2302    Faythe Ghee, PA-C 04/04/18  2303    Jeanmarie Plant, MD 04/04/18 [phone]

## 2018-04-04 NOTE — ED Triage Notes (Signed)
Pt to ED reporting headache and fever that started yesterday. Fever of 102.5 at home. Pt took tylenol and is 101.4 in triage. Pt reports her entire body aches and her throat is hurting. Pt has a hx of migraines and denies that this feel similar to one of those.

## 2018-04-04 NOTE — Discharge Instructions (Addendum)
Follow-up with your regular doctor or the acute care if you are not better in 3 days.  Return the emergency department if your symptoms are worsening.  You have been given a medication for flulike symptoms.  This will not cure you but it may decrease her symptoms.  This needs to be started within 24 to 48 hours of the onset of fever.

## 2018-07-13 ENCOUNTER — Emergency Department
Admission: EM | Admit: 2018-07-13 | Discharge: 2018-07-14 | Disposition: A | Discharge status: Other Acute Inpatient Hospital | Payer: Self-pay | Attending: Emergency Medicine | Admitting: Emergency Medicine

## 2018-07-13 ENCOUNTER — Other Ambulatory Visit: Payer: Self-pay

## 2018-07-13 ENCOUNTER — Encounter: Payer: Self-pay | Admitting: Emergency Medicine

## 2018-07-13 DIAGNOSIS — J45909 Unspecified asthma, uncomplicated: Secondary | ICD-10-CM | POA: Insufficient documentation

## 2018-07-13 DIAGNOSIS — Z7289 Other problems related to lifestyle: Secondary | ICD-10-CM | POA: Insufficient documentation

## 2018-07-13 DIAGNOSIS — Z91040 Latex allergy status: Secondary | ICD-10-CM | POA: Insufficient documentation

## 2018-07-13 DIAGNOSIS — Z046 Encounter for general psychiatric examination, requested by authority: Secondary | ICD-10-CM | POA: Insufficient documentation

## 2018-07-13 DIAGNOSIS — F329 Major depressive disorder, single episode, unspecified: Secondary | ICD-10-CM | POA: Insufficient documentation

## 2018-07-13 DIAGNOSIS — T39011A Poisoning by aspirin, accidental (unintentional), initial encounter: Secondary | ICD-10-CM | POA: Diagnosis present

## 2018-07-13 DIAGNOSIS — T39012A Poisoning by aspirin, intentional self-harm, initial encounter: Secondary | ICD-10-CM | POA: Insufficient documentation

## 2018-07-13 LAB — COMPREHENSIVE METABOLIC PANEL
ALBUMIN: 4.1 g/dL (ref 3.5–5.0)
ALT: 15 U/L (ref 0–44)
ANION GAP: 8 (ref 5–15)
AST: 21 U/L (ref 15–41)
Alkaline Phosphatase: 97 U/L (ref 38–126)
BUN: 13 mg/dL (ref 6–20)
CHLORIDE: 106 mmol/L (ref 98–111)
CO2: 26 mmol/L (ref 22–32)
Calcium: 9.2 mg/dL (ref 8.9–10.3)
Creatinine, Ser: 1.06 mg/dL — ABNORMAL HIGH (ref 0.44–1.00)
GFR calc Af Amer: >60 mL/min (ref >60)
Glucose, Bld: 86 mg/dL (ref 70–99)
POTASSIUM: 4.2 mmol/L (ref 3.5–5.1)
Sodium: 140 mmol/L (ref 135–145)
Total Bilirubin: 0.6 mg/dL (ref 0.3–1.2)
Total Protein: 7.8 g/dL (ref 6.5–8.1)

## 2018-07-13 LAB — URINALYSIS, COMPLETE (UACMP) WITH MICROSCOPIC
BILIRUBIN URINE: NEGATIVE
Bacteria, UA: NONE SEEN
GLUCOSE, UA: NEGATIVE mg/dL
HGB URINE DIPSTICK: NEGATIVE
KETONES UR: NEGATIVE mg/dL
LEUKOCYTE UA: NEGATIVE
Nitrite: NEGATIVE
PROTEIN: NEGATIVE mg/dL
Specific Gravity, Urine: 1.012 (ref 1.005–1.030)
pH: 7 (ref 5.0–8.0)

## 2018-07-13 LAB — URINE DRUG SCREEN, QUALITATIVE (ARMC ONLY)
AMPHETAMINES, UR SCREEN: NOT DETECTED
BARBITURATES, UR SCREEN: NOT DETECTED
BENZODIAZEPINE, UR SCRN: NOT DETECTED
CANNABINOID 50 NG, UR ~~LOC~~: POSITIVE — AB
Cocaine Metabolite,Ur ~~LOC~~: NOT DETECTED
MDMA (Ecstasy)Ur Screen: NOT DETECTED
Methadone Scn, Ur: NOT DETECTED
OPIATE, UR SCREEN: NOT DETECTED
Phencyclidine (PCP) Ur S: NOT DETECTED
Tricyclic, Ur Screen: NOT DETECTED

## 2018-07-13 LAB — ETHANOL: Alcohol, Ethyl (B): <10 mg/dL (ref <10)

## 2018-07-13 LAB — CBC WITH DIFFERENTIAL/PLATELET
Abs Immature Granulocytes: 0.02 10*3/uL (ref 0.00–0.07)
BASOS ABS: 0 10*3/uL (ref 0.0–0.1)
BASOS PCT: 0 %
EOS ABS: 0 10*3/uL (ref 0.0–0.5)
Eosinophils Relative: 1 %
HCT: 45.4 % (ref 36.0–46.0)
Hemoglobin: 14.1 g/dL (ref 12.0–15.0)
IMMATURE GRANULOCYTES: 0 %
Lymphocytes Relative: 26 %
Lymphs Abs: 1.4 10*3/uL (ref 0.7–4.0)
MCH: 24.2 pg — ABNORMAL LOW (ref 26.0–34.0)
MCHC: 31.1 g/dL (ref 30.0–36.0)
MCV: 78 fL — AB (ref 80.0–100.0)
Monocytes Absolute: 0.5 10*3/uL (ref 0.1–1.0)
Monocytes Relative: 9 %
NEUTROS PCT: 64 %
NRBC: 0 % (ref 0.0–0.2)
Neutro Abs: 3.6 10*3/uL (ref 1.7–7.7)
PLATELETS: 332 10*3/uL (ref 150–400)
RBC: 5.82 MIL/uL — ABNORMAL HIGH (ref 3.87–5.11)
RDW: 15.6 % — ABNORMAL HIGH (ref 11.5–15.5)
WBC: 5.6 10*3/uL (ref 4.0–10.5)

## 2018-07-13 LAB — POCT PREGNANCY, URINE: Preg Test, Ur: NEGATIVE

## 2018-07-13 LAB — SALICYLATE LEVEL
Salicylate Lvl: <7 mg/dL (ref 2.8–30.0)
Salicylate Lvl: <7 mg/dL (ref 2.8–30.0)

## 2018-07-13 LAB — ACETAMINOPHEN LEVEL: Acetaminophen (Tylenol), Serum: <10 ug/mL — ABNORMAL LOW (ref 10–30)

## 2018-07-13 MED ORDER — SODIUM CHLORIDE 0.9 % IV SOLN
1000.0000 mL | Freq: Once | INTRAVENOUS | Status: AC
  Administered 2018-07-13: 1000 mL via INTRAVENOUS

## 2018-07-13 MED ORDER — CHARCOAL ACTIVATED PO LIQD
50.0000 g | Freq: Once | ORAL | Status: AC
  Administered 2018-07-13: 50 g via ORAL
  Filled 2018-07-13: qty 240

## 2018-07-13 MED ORDER — PROMETHAZINE HCL 25 MG/ML IJ SOLN
25.0000 mg | Freq: Once | INTRAMUSCULAR | Status: AC
  Administered 2018-07-13: 25 mg via INTRAVENOUS
  Filled 2018-07-13: qty 1

## 2018-07-13 NOTE — ED Notes (Signed)
Assigned as 1:1 sitter in room.  Pt resting comfortably, cooperative.  Encouraged pt to continue drinking charcoal.

## 2018-07-13 NOTE — ED Notes (Signed)
Pt is resting in bed. Respirations even/unlabored. Nadn.   

## 2018-07-13 NOTE — ED Triage Notes (Signed)
PT to ER via EMS from home with reports of having a headache and placing 10-20 baby ASA in her drink and drinking it.  May also have been up to 40 ASA 81mg .  Pt denies having SI, but does endorse depression. Dr. Mayford Knife at bedside on pt arrival.

## 2018-07-13 NOTE — ED Notes (Signed)
Visitor at bedside.

## 2018-07-13 NOTE — ED Provider Notes (Signed)
[facility] Emergency Department Provider Note       Time seen: ----------------------------------------- 6:14 PM on 07/13/2018 -----------------------------------------   I have reviewed the triage vital signs and the nursing notes.  HISTORY   Chief Complaint Drug Overdose   HPI Candice Lewis is a 23 y.o. female with a history of asthma, GERD, gallstones, migraine, seizures who presents to the ED for possible overdose.  Patient arrives by EMS from home with reports of having a headache and placing somewhere between 10 and 40 baby aspirin and her drinking drinking it.  3240 possible milligrams of aspirin were ingested.  She did have vomiting prior to arrival.  EMS reports she is allergic to Zofran.  She does not want to kill her self but does endorse depression, has a sick family member.  Past Medical History:  Diagnosis Date  . Asthma    WELL CONTROLLED-NO INHALERS CURRENTLY   . Gallstones   . GERD (gastroesophageal reflux disease)    OCC-NO MEDS  . Migraine   . Migraine   . Seizures (HCC)    POSSIBLE AT AGE 42 OR 13 X 1-PT STATES UNSURE IF IT WAS A DEFINITE SEIZURE    Patient Active Problem List   Diagnosis Date Noted  . Biliary colic   . Abdominal pain, right upper quadrant 03/09/2017  . Positive GBS test 01/09/2017  . Pregnancy 12/17/2016  . Obesity (BMI 30.0-34.9) 06/23/2016  . Spontaneous vaginal delivery 06/23/2016  . Seizures (HCC) 06/20/2016  . Asthma 01/10/2013  . Migraine with aura 01/10/2013    Past Surgical History:  Procedure Laterality Date  . CHOLECYSTECTOMY N/A 03/24/2017   Procedure: LAPAROSCOPIC CHOLECYSTECTOMY;  Surgeon: Ricarda Frame, MD;  Location: [facility] ORS;  Service: General;  Laterality: N/A;  . WISDOM TOOTH EXTRACTION      Allergies Zofran [ondansetron hcl] and Latex  Social History Social History   Tobacco Use  . Smoking status: Never Smoker  . Smokeless tobacco: Never Used  Substance Use Topics  .  Alcohol use: No  . Drug use: No   Review of Systems Constitutional: Negative for fever. Cardiovascular: Negative for chest pain. Respiratory: Negative for shortness of breath. Gastrointestinal: Negative for abdominal pain, positive for vomiting Musculoskeletal: Negative for back pain. Skin: Negative for rash. Neurological: Negative for headaches, focal weakness or numbness. Psychiatric: Positive for overdose, depression  All systems negative/normal/unremarkable except as stated in the HPI  ____________________________________________   PHYSICAL EXAM:  VITAL SIGNS: ED Triage Vitals  Enc Vitals Group     BP 07/13/18 1813 132/82     Pulse Rate 07/13/18 1813 76     Resp 07/13/18 1813 16     Temp 07/13/18 1813 98.2 F (36.8 C)     Temp Source 07/13/18 1813 Oral     SpO2 07/13/18 1813 99 %     Weight 07/13/18 1814 215 lb (97.5 kg)     Height 07/13/18 1814 5' (1.524 m)     Head Circumference --      Peak Flow --      Pain Score 07/13/18 1813 8     Pain Loc --      Pain Edu? --      Excl. in GC? --    Constitutional: Alert and oriented. Well appearing and in no distress. Eyes: Conjunctivae are normal. Normal extraocular movements. ENT      Head: Normocephalic and atraumatic.      Nose: No congestion/rhinnorhea.      Mouth/Throat: Mucous membranes are  moist.      Neck: No stridor. Cardiovascular: Normal rate, regular rhythm. No murmurs, rubs, or gallops. Respiratory: Normal respiratory effort without tachypnea nor retractions. Breath sounds are clear and equal bilaterally. No wheezes/rales/rhonchi. Gastrointestinal: Soft and nontender. Normal bowel sounds Musculoskeletal: Nontender with normal range of motion in extremities. No lower extremity tenderness nor edema. Neurologic:  Normal speech and language. No gross focal neurologic deficits are appreciated.  Skin:  Skin is warm, dry and intact. No rash noted. Psychiatric: Flat affect, depressed  mood ____________________________________________  EKG: Interpreted by me.  Sinus rhythm the rate of 76 bpm, normal PR interval, normal QRS, normal QT  ____________________________________________  ED COURSE:  As part of my medical decision making, I reviewed the following data within the electronic MEDICAL RECORD NUMBER History obtained from family if available, nursing notes, old chart and ekg, as well as notes from prior ED visits. Patient presented for overdose, we will assess with labs and imaging as indicated at this time.   Procedures ____________________________________________   LABS (pertinent positives/negatives)  Labs Reviewed  CBC WITH DIFFERENTIAL/PLATELET - Abnormal; Notable for the following components:      Result Value   RBC 5.82 (*)    MCV 78.0 (*)    MCH 24.2 (*)    RDW 15.6 (*)    All other components within normal limits  COMPREHENSIVE METABOLIC PANEL - Abnormal; Notable for the following components:   Creatinine, Ser 1.06 (*)    All other components within normal limits  URINALYSIS, COMPLETE (UACMP) WITH MICROSCOPIC - Abnormal; Notable for the following components:   Color, Urine STRAW (*)    APPearance CLEAR (*)    All other components within normal limits  URINE DRUG SCREEN, QUALITATIVE ([facility] ONLY) - Abnormal; Notable for the following components:   Cannabinoid 50 Ng, Ur [place] POSITIVE (*)    All other components within normal limits  ACETAMINOPHEN LEVEL  ETHANOL  SALICYLATE LEVEL  POC URINE PREG, ED  POCT PREGNANCY, URINE   CRITICAL CARE Performed by: Ulice Dash   Total critical care time: 30 minutes  Critical care time was exclusive of separately billable procedures and treating other patients.  Critical care was necessary to treat or prevent imminent or life-threatening deterioration.  Critical care was time spent personally by me on the following activities: development of treatment plan with patient and/or surrogate as well as  nursing, discussions with consultants, evaluation of patient's response to treatment, examination of patient, obtaining history from patient or surrogate, ordering and performing treatments and interventions, ordering and review of laboratory studies, ordering and review of radiographic studies, pulse oximetry and re-evaluation of patient's condition.  ____________________________________________   DIFFERENTIAL DIAGNOSIS   Overdose, depression, suicidal ideation, dehydration, pregnancy, electrolyte abnormality  FINAL ASSESSMENT AND PLAN  Intentional aspirin overdose  Plan: The patient had presented for intentional aspirin overdose. Patient's labs so far are reassuring.  Patient was given a dose of activated charcoal as well as fluids.  She does not exhibit any signs of aspirin toxicity.  She will need a repeat aspirin level in 2 hours.  She has been involuntarily committed.  Ulice Dash, MD    Note: This note was generated in part or whole with voice recognition software. Voice recognition is usually quite accurate but there are transcription errors that can and very often do occur. I apologize for any typographical errors that were not detected and corrected.     Emily Filbert, MD 07/13/18 2016

## 2018-07-13 NOTE — ED Notes (Signed)
Called lab regarding the recollect of red top tube due to hemolyis of first sample.  They stated the were waiting on the second sample to arrive.  Report was given to RN that the second sample was sent.  Lab called back stating they found the second sample and will run the test asap.

## 2018-07-14 ENCOUNTER — Inpatient Hospital Stay
Admission: AD | Admit: 2018-07-14 | Discharge: 2018-07-15 | DRG: 881 | Disposition: A | Discharge status: Home / Self Care | Payer: No Typology Code available for payment source | Source: Intra-hospital | Attending: Psychiatry | Admitting: Psychiatry

## 2018-07-14 ENCOUNTER — Encounter: Payer: Self-pay | Admitting: Emergency Medicine

## 2018-07-14 ENCOUNTER — Other Ambulatory Visit: Payer: Self-pay

## 2018-07-14 DIAGNOSIS — T39011A Poisoning by aspirin, accidental (unintentional), initial encounter: Secondary | ICD-10-CM | POA: Diagnosis present

## 2018-07-14 DIAGNOSIS — K219 Gastro-esophageal reflux disease without esophagitis: Secondary | ICD-10-CM | POA: Diagnosis present

## 2018-07-14 DIAGNOSIS — J45909 Unspecified asthma, uncomplicated: Secondary | ICD-10-CM | POA: Diagnosis present

## 2018-07-14 DIAGNOSIS — Z9189 Other specified personal risk factors, not elsewhere classified: Secondary | ICD-10-CM

## 2018-07-14 DIAGNOSIS — F329 Major depressive disorder, single episode, unspecified: Principal | ICD-10-CM | POA: Diagnosis present

## 2018-07-14 DIAGNOSIS — Z888 Allergy status to other drugs, medicaments and biological substances status: Secondary | ICD-10-CM | POA: Diagnosis not present

## 2018-07-14 DIAGNOSIS — Z91040 Latex allergy status: Secondary | ICD-10-CM | POA: Diagnosis not present

## 2018-07-14 DIAGNOSIS — G43909 Migraine, unspecified, not intractable, without status migrainosus: Secondary | ICD-10-CM | POA: Diagnosis present

## 2018-07-14 DIAGNOSIS — Z8669 Personal history of other diseases of the nervous system and sense organs: Secondary | ICD-10-CM

## 2018-07-14 DIAGNOSIS — F1220 Cannabis dependence, uncomplicated: Secondary | ICD-10-CM | POA: Diagnosis present

## 2018-07-14 MED ORDER — VENLAFAXINE HCL ER 75 MG PO CP24
150.0000 mg | ORAL_CAPSULE | Freq: Every day | ORAL | Status: DC
  Administered 2018-07-15: 150 mg via ORAL
  Filled 2018-07-14: qty 2

## 2018-07-14 MED ORDER — HYDROXYZINE HCL 25 MG PO TABS: 25.0000 mg | ORAL_TABLET | Freq: Four times a day (QID) | ORAL | Status: DC | PRN

## 2018-07-14 MED ORDER — ALUM & MAG HYDROXIDE-SIMETH 200-200-20 MG/5ML PO SUSP: 30.0000 mL | ORAL | Status: DC | PRN

## 2018-07-14 MED ORDER — ACETAMINOPHEN 325 MG PO TABS: 650.0000 mg | ORAL_TABLET | Freq: Four times a day (QID) | ORAL | Status: DC | PRN

## 2018-07-14 MED ORDER — MAGNESIUM HYDROXIDE 400 MG/5ML PO SUSP: 30.0000 mL | Freq: Every day | ORAL | Status: DC | PRN

## 2018-07-14 MED ORDER — ACETAMINOPHEN 500 MG PO TABS
ORAL_TABLET | ORAL | Status: AC
  Administered 2018-07-14: 1000 mg via ORAL
  Filled 2018-07-14: qty 2

## 2018-07-14 MED ORDER — TRAZODONE HCL 100 MG PO TABS
100.0000 mg | ORAL_TABLET | Freq: Every day | ORAL | Status: DC
  Administered 2018-07-14: 100 mg via ORAL
  Filled 2018-07-14: qty 1

## 2018-07-14 MED ORDER — ACETAMINOPHEN 500 MG PO TABS
1000.0000 mg | ORAL_TABLET | Freq: Once | ORAL | Status: AC
  Administered 2018-07-14: 1000 mg via ORAL

## 2018-07-14 NOTE — ED Notes (Signed)
Patient observed lying in bed with eyes closed  Even, unlabored respirations observed   NAD pt appears to be sleeping  I will continue to monitor along with every 15 minute visual observations and ongoing security monitoring    

## 2018-07-14 NOTE — ED Notes (Signed)
Hourly rounding reveals patient sleeping in room. No complaints, stable, in no acute distress. Q15 minute rounds and monitoring via Security Cameras to continue. 

## 2018-07-14 NOTE — ED Notes (Signed)
BEHAVIORAL HEALTH ROUNDING Patient sleeping: No. Patient alert and oriented: yes Behavior appropriate: Yes.  ; If no, describe:  Nutrition and fluids offered: yes Toileting and hygiene offered: Yes  Sitter present: q15 minute observations and security  monitoring Law enforcement present: Yes  ODS  

## 2018-07-14 NOTE — BH Assessment (Signed)
Assessment Note  Candice Lewis is an 23 y.o. female Who presents to the ED via EMS from home with reports of having a headache and placing 10-20 baby ASA in her drink and drinking it.  Pt reports that it may also have been up to 40 ASA 81mg .  Pt denies having SI, but does endorse depression. Pt states "Some days I feel more sad than others." Pt live at home with her Grandmother and one-year old son. Pt's mother passed away when she was 76 years old in a car crash. P reports that she had a "really really bad headache that would not go away even with rest. She states that she took the baby Aspirin to make her headache go away and reports that she took such a large amount because the dosage was so small. Pt's grandmother was called to get collateral information on pt. She reports that pt can be "manipulative" and has not been forthcoming with information to her.   During the assessment the pt was calm, cooperative, and answered assessment questions as they were asked. Pt reported that she did not use any illicit drugs or alcohol however, her UDS showed her positive for marijuana. Pt is currently unemployed and reports no involvement in the legal system. Pt denies SI HI A/V H/D.   Diagnosis: Depression  Past Medical History:  Past Medical History:  Diagnosis Date  . Asthma    WELL CONTROLLED-NO INHALERS CURRENTLY   . Gallstones   . GERD (gastroesophageal reflux disease)    OCC-NO MEDS  . Migraine   . Migraine   . Seizures (HCC)    POSSIBLE AT AGE 47 OR 13 X 1-PT STATES UNSURE IF IT WAS A DEFINITE SEIZURE    Past Surgical History:  Procedure Laterality Date  . CHOLECYSTECTOMY N/A 03/24/2017   Procedure: LAPAROSCOPIC CHOLECYSTECTOMY;  Surgeon: Ricarda Frame, MD;  Location: [facility] ORS;  Service: General;  Laterality: N/A;  . WISDOM TOOTH EXTRACTION      Family History:  Family History  Problem Relation Age of Onset  . Gallstones Mother     Social History:  reports that she has never  smoked. She has never used smokeless tobacco. She reports that she does not drink alcohol or use drugs.  Additional Social History:  Alcohol / Drug Use Pain Medications: SEE MAR Prescriptions: SEE MAR Over the Counter: SEE MAR History of alcohol / drug use?: Yes Substance #1 Name of Substance 1: Marijuana(Denies use but tested positive)  CIWA: CIWA-Ar BP: 106/65 Pulse Rate: 64 COWS:    Allergies:  Allergies  Allergen Reactions  . Zofran [Ondansetron Hcl] Other (See Comments)    AMS  . Latex Rash    Home Medications: (Not in a hospital admission)   OB/GYN Status:  Patient's last menstrual period was 06/22/2018.  General Assessment Data Location of Assessment: [facility] ED TTS Assessment: In system Is this a Tele or Face-to-Face Assessment?: Face-to-Face Is this an Initial Assessment or a Re-assessment for this encounter?: Initial Assessment Patient Accompanied by:: N/A Language Other than English: No Living Arrangements: Other (Comment) What gender do you identify as?: Female Marital status: Single Pregnancy Status: No Living Arrangements: Children, Other relatives Can pt return to current living arrangement?: Yes Admission Status: Involuntary Petitioner: ED Attending Is patient capable of signing voluntary admission?: No Referral Source: Self/Family/Friend Insurance type: none  Medical Screening Exam [facility] Walk-in ONLY) Medical Exam completed: Yes  Crisis Care Plan Living Arrangements: Children, Other relatives Legal Guardian: Other:(self) Name of Psychiatrist:  n/a Name of Therapist: n/a  Education Status Is patient currently in school?: No Is the patient employed, unemployed or receiving disability?: Unemployed  Risk to self with the past 6 months Suicidal Ideation: No Has patient been a risk to self within the past 6 months prior to admission? : No Suicidal Intent: No Has patient had any suicidal intent within the past 6 months prior to admission? : No Is  patient at risk for suicide?: No Suicidal Plan?: No Has patient had any suicidal plan within the past 6 months prior to admission? : No Access to Means: No What has been your use of drugs/alcohol within the last 12 months?: uses marijuana(denies but tested positive) Previous Attempts/Gestures: No How many times?: 0 Other Self Harm Risks: n/a Triggers for Past Attempts: Other (Comment) Intentional Self Injurious Behavior: None Family Suicide History: No Recent stressful life event(s): Other (Comment) Persecutory voices/beliefs?: No Depression: Yes Depression Symptoms: Loss of interest in usual pleasures, Feeling worthless/self pity, Tearfulness Substance abuse history and/or treatment for substance abuse?: No Suicide prevention information given to non-admitted patients: Not applicable  Risk to Others within the past 6 months Homicidal Ideation: No Does patient have any lifetime risk of violence toward others beyond the six months prior to admission? : No Thoughts of Harm to Others: No Current Homicidal Intent: No Current Homicidal Plan: No Access to Homicidal Means: No Identified Victim: none History of harm to others?: No Assessment of Violence: None Noted Does patient have access to weapons?: No Criminal Charges Pending?: No Does patient have a court date: No Is patient on probation?: No  Psychosis Hallucinations: None noted Delusions: None noted  Mental Status Report Appearance/Hygiene: Unremarkable, In scrubs Eye Contact: Fair Motor Activity: Freedom of movement Speech: Logical/coherent Level of Consciousness: Alert Mood: Sad Affect: Sad Anxiety Level: None Thought Processes: Coherent, Relevant Judgement: Impaired Orientation: Person, Time, Place, Situation Obsessive Compulsive Thoughts/Behaviors: Minimal  Cognitive Functioning Concentration: Normal Memory: Recent Intact, Remote Intact Is patient IDD: No Insight: Good Impulse Control: Good Appetite:  Good Have you had any weight changes? : Gain Amount of the weight change? (lbs): 10 lbs Sleep: No Change Total Hours of Sleep: 7 Vegetative Symptoms: None  ADLScreening [facility] Assessment Services) Patient's cognitive ability adequate to safely complete daily activities?: Yes Patient able to express need for assistance with ADLs?: Yes Independently performs ADLs?: Yes (appropriate for developmental age)  Prior Inpatient Therapy Prior Inpatient Therapy: No  Prior Outpatient Therapy Prior Outpatient Therapy: No Does patient have an ACCT team?: No Does patient have Intensive In-House Services?  : No Does patient have [facility] services? : No Does patient have P4CC services?: No  ADL Screening (condition at time of admission) Patient's cognitive ability adequate to safely complete daily activities?: Yes Is the patient deaf or have difficulty hearing?: No Does the patient have difficulty seeing, even when wearing glasses/contacts?: No Does the patient have difficulty concentrating, remembering, or making decisions?: No Patient able to express need for assistance with ADLs?: Yes Does the patient have difficulty dressing or bathing?: No Independently performs ADLs?: Yes (appropriate for developmental age) Communication: Independent Does the patient have difficulty walking or climbing stairs?: No Weakness of Legs: None Weakness of Arms/Hands: None  Home Assistive Devices/Equipment Home Assistive Devices/Equipment: None  Therapy Consults (therapy consults require a physician order) PT Evaluation Needed: No OT Evalulation Needed: No SLP Evaluation Needed: No Abuse/Neglect Assessment (Assessment to be complete while patient is alone) Abuse/Neglect Assessment Can Be Completed: Yes Physical Abuse: Denies Verbal Abuse: Denies  Sexual Abuse: Denies Exploitation of patient/patient's resources: Denies Self-Neglect: Denies Values / Beliefs Cultural Requests During Hospitalization:  None Spiritual Requests During Hospitalization: None Consults Spiritual Care Consult Needed: No Social Work Consult Needed: No Merchant navy officerAdvance Directives (For Healthcare) Does Patient Have a Medical Advance Directive?: No Would patient like information on creating a medical advance directive?: No - Patient declined          Disposition:  Disposition Initial Assessment Completed for this Encounter: Yes Disposition of Patient: Admit Type of inpatient treatment program: Adult Patient refused recommended treatment: No Mode of transportation if patient is discharged/movement?: Car  On Site Evaluation by:   Reviewed with Physician:    Demetres Prochnow D Jacoria Keiffer 07/14/2018 5:23 AM

## 2018-07-14 NOTE — ED Notes (Signed)
BEHAVIORAL HEALTH ROUNDING Patient sleeping: No. Patient alert and oriented: yes Behavior appropriate: Yes.  ; If no, describe:  Nutrition and fluids offered: yes Toileting and hygiene offered: Yes  Sitter present: q15 minute observations and security camera monitoring Law enforcement present: Yes  ODS  

## 2018-07-14 NOTE — BHH Suicide Risk Assessment (Addendum)
[facility] Admission Suicide Risk Assessment   Nursing information obtained from:  Patient Demographic factors:  Adolescent or young adult Current Mental Status:  NA Loss Factors:  NA Historical Factors:  NA Risk Reduction Factors:  Sense of responsibility to family, Positive social support  Total Time spent with patient: 1 hour Principal Problem: Unintentional poisoning by aspirin Diagnosis:  Principal Problem:   Unintentional poisoning by aspirin Active Problems:   Cannabis use disorder, moderate, dependence (HCC)   History of migraine headaches  Subjective Data: accidental overdose on aspirin  Continued Clinical Symptoms:  Alcohol Use Disorder Identification Test Final Score (AUDIT): 0 The "Alcohol Use Disorders Identification Test", Guidelines for Use in Primary Care, Second Edition.  World Science writer [facility]). Score between 0-7:  no or low risk or alcohol related problems. Score between 8-15:  moderate risk of alcohol related problems. Score between 16-19:  high risk of alcohol related problems. Score 20 or above:  warrants further diagnostic evaluation for alcohol dependence and treatment.   CLINICAL FACTORS:   Chronic Pain Medical Diagnoses and Treatments/Surgeries   Musculoskeletal: Strength & Muscle Tone: within normal limits Gait & Station: normal Patient leans: N/A  Psychiatric Specialty Exam: Physical Exam  Nursing note and vitals reviewed. Psychiatric: She has a normal mood and affect. Her speech is normal and behavior is normal. Judgment and thought content normal. Cognition and memory are normal.    Review of Systems  Neurological: Positive for headaches.  Psychiatric/Behavioral: Negative.   All other systems reviewed and are negative.   Blood pressure 127/82, pulse 93, temperature 98.5 F (36.9 C), temperature source Oral, resp. rate 18, height 5' (1.524 m), weight 90.7 kg, last menstrual period 06/22/2018, SpO2 98 %.Body mass index is 39.06 kg/m.   General Appearance: Casual  Eye Contact:  Good  Speech:  Clear and Coherent  Volume:  Normal  Mood:  Euthymic  Affect:  Appropriate  Thought Process:  Goal Directed and Descriptions of Associations: Intact  Orientation:  Full (Time, Place, and Person)  Thought Content:  WDL  Suicidal Thoughts:  No  Homicidal Thoughts:  No  Memory:  Immediate;   Fair Recent;   Fair Remote;   Fair  Judgement:  Impaired  Insight:  Present  Psychomotor Activity:  Normal  Concentration:  Concentration: Fair and Attention Span: Fair  Recall:  [employer] of Knowledge:  Fair  Language:  Fair  Akathisia:  No  Handed:  Right  AIMS (if indicated):     Assets:  Communication Skills Desire for Improvement Financial Resources/Insurance Housing Physical Health Resilience Social Support Transportation Vocational/Educational  ADL's:  Intact  Cognition:  WNL  Sleep:  Number of Hours: 6.15      COGNITIVE FEATURES THAT CONTRIBUTE TO RISK:  None    SUICIDE RISK:   Minimal: No identifiable suicidal ideation.  Patients presenting with no risk factors but with morbid ruminations; may be classified as minimal risk based on the severity of the depressive symptoms  PLAN OF CARE: hospital admission, medication management, substance abuse counselig, discharge planning.  Candice Lewis is a 23 year old female with a history of mild depression admitted after unintentional overdose on ASA. The patient adamantly denies any thoughts, intention or plans to hurt herself or others. She is able to contract for safety. She is forward thinking and optimistic about the future. She is a loving mother and granddaughter.  #Suicidal ideation -patient is able to contract for safety in the hospital  #Mood -denies depressive symptoms -declines pharmacotherapy  #Migraine  headache -Imitrex 50 mg PRN  #Cannabis abuse -minimizes problem and declines treatment  #Labs -pregnancy test negative  #Disposition -discharge to  home with family -follow up with [facility]  I certify that inpatient services furnished can reasonably be expected to improve the patient's condition.   Kristine Linea, MD 07/15/2018, 10:56 AM

## 2018-07-14 NOTE — ED Notes (Signed)
Pt. Transferred to BHU from ED to room 4 after screening for contraband. Report to include Situation, Background, Assessment and Recommendations from Hewan RN. Pt. Oriented to unit including Q15 minute rounds as well as the security cameras for their protection. Patient is alert and oriented, warm and dry in no acute distress. Patient denies SI, HI, and AVH. Pt. Encouraged to let me know if needs arise. 

## 2018-07-14 NOTE — Plan of Care (Signed)
  Problem: Education: Goal: Knowledge of [facility] Education information/materials will improve Note:  Instructed on unit programing and [facility] Education , verbalize understanding

## 2018-07-14 NOTE — Consult Note (Signed)
[facility]   Reason for Consult:  Drug Overdose Referring Physician:  Dr. Mayford KnifeWilliams Patient Identification: Candice Lewis MRN:  [id number] Principal Diagnosis: Intentional aspirin overdose Candice Lewis(HCC) Diagnosis:  Principal Problem:   Intentional aspirin overdose (HCC)   Total Time spent with patient: 1 hour  Subjective: "I only took 1 or two Asprin for my Migraine"   Candice Lewis is a 23 y.o. female patient [facility] ED via EMS due to her overdosing on Asprin. The patient was seen face-to-face by this provider; chart reviewed and consulted with Dr. Manson PasseyBrown on 07/13/2018 that it is recommended that the patient be admitted to inpatient. As discussed with Dr. Manson PasseyBrown that the patient is very vague with her answers and she is not forthcoming with what transpired causing her to come to the [facility]. On evaluation the patient reports that nothing happened that she can remember that happened to cause her to come to the [facility].  During her evaluation, she is alert and oriented x4, calm and vague in answering questions, and her mood is depressed. The patient does not appear to be responding to internal or external stimuli. Neither is the patient presenting with any delusional thinking. The patient denies any suicidal, homicidal, or self-harm ideations. The patient is not presenting with any psychotic or paranoid behaviors. During an encounter with the patient, she not forthcoming with her  answers to the questions presented to her.  Collaboration: With the patient grandmother Candice Lewis(Candice Lewis [phone]) Candice Lewis stated that "I am in the dark also. I do not know what happen for her to go to the Lewis." Candice Lewis said it was told to hereby the patient "girlfriend" that the patient was being transported to the Lewis. Candice Lewis, stated that the patient lost her mom at the age of 89 y/o in a MVA. Grandma, expressed that the patient lost her mom over 15 years ago and when the patient  get into trouble she tend to use her mother death as to the reason why.  HPI: Per Dr. Mayford KnifeWilliams; Candice Lewis is a 23 y.o. female with a history of asthma, GERD, gallstones, migraine, seizures who presents to the ED for possible overdose.  Patient arrives by EMS from home with reports of having a headache and placing somewhere between 10 and 40 baby aspirin and her drinking it.  3240 possible milligrams of aspirin were ingested.  She did have vomiting prior to arrival.  EMS reports she is allergic to Zofran.  She does not want to kill her self but does endorse depression, has a sick family member.  Past Psychiatric History:  None  Risk to Self:  No Risk to Others:  No Prior Inpatient Therapy:  No Prior Outpatient Therapy:  No  Past Medical History:  Yes Past Medical History:  Diagnosis Date  . Asthma    WELL CONTROLLED-NO INHALERS CURRENTLY   . Gallstones   . GERD (gastroesophageal reflux disease)    OCC-NO MEDS  . Migraine   . Migraine   . Seizures (HCC)    POSSIBLE AT AGE 7 OR 14 X 1-PT STATES UNSURE IF IT WAS A DEFINITE SEIZURE    Past Surgical History:  Procedure Laterality Date  . CHOLECYSTECTOMY N/A 03/24/2017   Procedure: LAPAROSCOPIC CHOLECYSTECTOMY;  Surgeon: Ricarda FrameWoodham, Charles, MD;  Location: [facility] ORS;  Service: General;  Laterality: N/A;  . WISDOM TOOTH EXTRACTION     Family History:  Family History  Problem Relation Age of Onset  . Gallstones  Mother    Family Psychiatric  History: None Social History:  Substance use Social History   Substance and Sexual Activity  Alcohol Use No     Social History   Substance and Sexual Activity  Drug Use No    Social History   Socioeconomic History  . Marital status: Single    Spouse name: Not on file  . Number of children: Not on file  . Years of education: Not on file  . Highest education level: Not on file  Occupational History  . Not on file  Social Needs  . Financial resource strain: Not on file  . Food  insecurity:    Worry: Not on file    Inability: Not on file  . Transportation needs:    Medical: Not on file    Non-medical: Not on file  Tobacco Use  . Smoking status: Never Smoker  . Smokeless tobacco: Never Used  Substance and Sexual Activity  . Alcohol use: No  . Drug use: No  . Sexual activity: Not Currently    Birth control/protection: None  Lifestyle  . Physical activity:    Days per week: Not on file    Minutes per session: Not on file  . Stress: Not on file  Relationships  . Social connections:    Talks on phone: Not on file    Gets together: Not on file    Attends religious service: Not on file    Active member of club or organization: Not on file    Attends meetings of clubs or organizations: Not on file    Relationship status: Not on file  Other Topics Concern  . Not on file  Social History Narrative  . Not on file   Additional Social History:    Allergies:   Allergies  Allergen Reactions  . Zofran [Ondansetron Hcl] Other (See Comments)    AMS  . Latex Rash    Labs:  Results for orders placed or performed during the Lewis encounter of 07/13/18 (from the past 48 hour(s))  CBC with Differential     Status: Abnormal   Collection Time: 07/13/18  6:15 PM  Result Value Ref Range   WBC 5.6 4.0 - 10.5 K/uL   RBC 5.82 (H) 3.87 - 5.11 MIL/uL   Hemoglobin 14.1 12.0 - 15.0 g/dL   HCT 54.6 27.0 - 35.0 %   MCV 78.0 (L) 80.0 - 100.0 fL   MCH 24.2 (L) 26.0 - 34.0 pg   MCHC 31.1 30.0 - 36.0 g/dL   RDW 09.3 (H) 81.8 - 29.9 %   Platelets 332 150 - 400 K/uL   nRBC 0.0 0.0 - 0.2 %   Neutrophils Relative % 64 %   Neutro Abs 3.6 1.7 - 7.7 K/uL   Lymphocytes Relative 26 %   Lymphs Abs 1.4 0.7 - 4.0 K/uL   Monocytes Relative 9 %   Monocytes Absolute 0.5 0.1 - 1.0 K/uL   Eosinophils Relative 1 %   Eosinophils Absolute 0.0 0.0 - 0.5 K/uL   Basophils Relative 0 %   Basophils Absolute 0.0 0.0 - 0.1 K/uL   Immature Granulocytes 0 %   Abs Immature Granulocytes  0.02 0.00 - 0.07 K/uL    Comment: Performed at [facility], [address] [place], [place] [address]  Comprehensive metabolic panel     Status: Abnormal   Collection Time: 07/13/18  6:15 PM  Result Value Ref Range   Sodium 140 135 - 145 mmol/L  Potassium 4.2 3.5 - 5.1 mmol/L   Chloride 106 98 - 111 mmol/L   CO2 26 22 - 32 mmol/L   Glucose, Bld 86 70 - 99 mg/dL   BUN 13 6 - 20 mg/dL   Creatinine, Ser 1.611.06 (H) 0.44 - 1.00 mg/dL   Calcium 9.2 8.9 - 09.610.3 mg/dL   Total Protein 7.8 6.5 - 8.1 g/dL   Albumin 4.1 3.5 - 5.0 g/dL   AST 21 15 - 41 U/L   ALT 15 0 - 44 U/L   Alkaline Phosphatase 97 38 - 126 U/L   Total Bilirubin 0.6 0.3 - 1.2 mg/dL   GFR calc non Af Amer >60 >60 mL/min   GFR calc Af Amer >60 >60 mL/min   Anion gap 8 5 - 15    Comment: Performed at [facility], [address] [place], [place] [address]  Urinalysis, Complete w Microscopic     Status: Abnormal   Collection Time: 07/13/18  6:15 PM  Result Value Ref Range   Color, Urine STRAW (A) YELLOW   APPearance CLEAR (A) CLEAR   Specific Gravity, Urine 1.012 1.005 - 1.030   pH 7.0 5.0 - 8.0   Glucose, UA NEGATIVE NEGATIVE mg/dL   Hgb urine dipstick NEGATIVE NEGATIVE   Bilirubin Urine NEGATIVE NEGATIVE   Ketones, ur NEGATIVE NEGATIVE mg/dL   Protein, ur NEGATIVE NEGATIVE mg/dL   Nitrite NEGATIVE NEGATIVE   Leukocytes,Ua NEGATIVE NEGATIVE   RBC / HPF 0-5 0 - 5 RBC/hpf   WBC, UA 0-5 0 - 5 WBC/hpf   Bacteria, UA NONE SEEN NONE SEEN   Squamous Epithelial / LPF 0-5 0 - 5   Mucus PRESENT     Comment: Performed at [facility], [address] [place], [place] [address]  Urine Drug Screen, Qualitative ([facility] only)     Status: Abnormal   Collection Time: 07/13/18  6:15 PM  Result Value Ref Range   Tricyclic, Ur Screen NONE DETECTED NONE DETECTED   Amphetamines, Ur Screen NONE DETECTED NONE DETECTED   MDMA (Ecstasy)Ur Screen NONE DETECTED NONE DETECTED   Cocaine Metabolite,Ur [place] NONE  DETECTED NONE DETECTED   Opiate, Ur Screen NONE DETECTED NONE DETECTED   Phencyclidine (PCP) Ur S NONE DETECTED NONE DETECTED   Cannabinoid 50 Ng, Ur [place] POSITIVE (A) NONE DETECTED   Barbiturates, Ur Screen NONE DETECTED NONE DETECTED   Benzodiazepine, Ur Scrn NONE DETECTED NONE DETECTED   Methadone Scn, Ur NONE DETECTED NONE DETECTED    Comment: (NOTE) Tricyclics + metabolites, urine    Cutoff 1000 ng/mL Amphetamines + metabolites, urine  Cutoff 1000 ng/mL MDMA (Ecstasy), urine              Cutoff 500 ng/mL Cocaine Metabolite, urine          Cutoff 300 ng/mL Opiate + metabolites, urine        Cutoff 300 ng/mL Phencyclidine (PCP), urine         Cutoff 25 ng/mL Cannabinoid, urine                 Cutoff 50 ng/mL Barbiturates + metabolites, urine  Cutoff 200 ng/mL Benzodiazepine, urine              Cutoff 200 ng/mL Methadone, urine                   Cutoff 300 ng/mL The urine drug screen provides only a preliminary, unconfirmed analytical test result and should not be used for non-medical  purposes. Clinical consideration and professional judgment should be applied to any positive drug screen result due to possible interfering substances. A more specific alternate chemical method must be used in order to obtain a confirmed analytical result. Gas chromatography / mass spectrometry (GC/MS) is the preferred confirmat ory method. Performed at [facility], [address] [place], [place] [address]   Acetaminophen level     Status: Abnormal   Collection Time: 07/13/18  6:51 PM  Result Value Ref Range   Acetaminophen (Tylenol), Serum <10 (L) 10 - 30 ug/mL    Comment: (NOTE) Therapeutic concentrations vary significantly. A range of 10-30 ug/mL  may be an effective concentration for many patients. However, some  are best treated at concentrations outside of this range. Acetaminophen concentrations >150 ug/mL at 4 hours after ingestion  and >50 ug/mL at 12 hours after ingestion  are often associated with  toxic reactions. Performed at [facility], [address] [place], [place] [address]   Ethanol     Status: None   Collection Time: 07/13/18  6:51 PM  Result Value Ref Range   Alcohol, Ethyl (B) <10 <10 mg/dL    Comment: (NOTE) Lowest detectable limit for serum alcohol is 10 mg/dL. For medical purposes only. Performed at [facility], [address] [place], [place] [address]   Salicylate level     Status: None   Collection Time: 07/13/18  6:51 PM  Result Value Ref Range   Salicylate Lvl <7.0 2.8 - 30.0 mg/dL    Comment: Performed at [facility], [address] [place], [place] [address]  Pregnancy, urine POC     Status: None   Collection Time: 07/13/18  6:57 PM  Result Value Ref Range   Preg Test, Ur NEGATIVE NEGATIVE    Comment:        THE SENSITIVITY OF THIS METHODOLOGY IS >24 mIU/mL   Salicylate level     Status: None   Collection Time: 07/13/18 10:11 PM  Result Value Ref Range   Salicylate Lvl <7.0 2.8 - 30.0 mg/dL    Comment: Performed at [facility], [address] [place], [place] [address]    No current facility-administered medications for this encounter.    No current outpatient medications on file.    Musculoskeletal: Strength & Muscle Tone: within normal limits Gait & Station: normal Patient leans: N/A  Psychiatric Specialty Exam: Physical Exam  Nursing note and vitals reviewed. Constitutional: She is oriented to person, place, and time. She appears well-developed and well-nourished.  HENT:  Head: Normocephalic.  Eyes: Pupils are equal, round, and reactive to light. Conjunctivae and EOM are normal.  Neck: Normal range of motion. Neck supple.  Cardiovascular: Normal rate and regular rhythm.  Respiratory: Effort normal and breath sounds normal.  Musculoskeletal: Normal range of motion.  Neurological: She is alert and oriented to person, place, and time. She has normal reflexes.   Skin: Skin is warm and dry.    Review of Systems  Constitutional: Negative.   HENT: Negative.   Eyes: Negative.   Respiratory: Negative.   Cardiovascular: Negative.   Gastrointestinal: Negative.   Genitourinary: Negative.   Musculoskeletal: Negative.   Skin: Negative.   Neurological: Negative.   Endo/Heme/Allergies: Negative.   Psychiatric/Behavioral: Positive for depression and substance abuse. Negative for hallucinations, memory loss and suicidal ideas. The patient is nervous/anxious. The patient does not have insomnia.     Blood pressure 106/65, pulse 64, temperature 98.2 F (36.8 C), temperature source  Oral, resp. rate (!) 0, height 5' (1.524 m), weight 97.5 kg, last menstrual period 06/22/2018, SpO2 98 %.Body mass index is 41.99 kg/m.  General Appearance: Fairly Groomed  Eye Contact:  Minimal  Speech:  Slow  Volume:  Decreased  Mood:  Depressed  Affect:  Depressed and Flat  Thought Process:  Coherent  Orientation:  Full (Time, Place, and Person)  Thought Content:  Logical  Suicidal Thoughts:  No  Homicidal Thoughts:  No  Memory:  Immediate;   Good  Judgement:  Poor  Insight:  Lacking  Psychomotor Activity:  Normal  Concentration:  Concentration: Fair  Recall:  Good  Fund of Knowledge:  Good  Language:  Good  Akathisia:  No  Handed:  Right  AIMS (if indicated):     Assets:  Desire for Improvement  ADL's:  Intact  Cognition:  WNL  Sleep:   Well     Treatment Plan Summary: Daily contact with patient to assess and evaluate symptoms and progress in treatment and Medication management  Disposition: Recommend psychiatric Inpatient admission when medically cleared. Supportive therapy provided about ongoing stressors. Discussed crisis plan, support from social network, calling 911, coming to the Emergency Department, and calling Suicide Hotline.  Catalina Gravel, NP 07/14/2018 5:00 AM

## 2018-07-14 NOTE — ED Notes (Signed)
ED BHU  Is the patient under IVC or is there intent for IVC: Yes.   Is the patient medically cleared: Yes.   Is there vacancy in the ED BHU: Yes.   Is the population mix appropriate for patient: Yes.   Is the patient awaiting placement in inpatient or outpatient setting: Yes.  Awaiting inpatient treatment  LL BMU  Has the patient had a psychiatric consult: Yes.   Survey of unit performed for contraband, proper placement and condition of furniture, tampering with fixtures in bathroom, shower, and each patient room: Yes.  ; Findings:  APPEARANCE/BEHAVIOR Calm and cooperative NEURO ASSESSMENT Orientation: oriented x3  Denies pain Hallucinations: No.None noted (Hallucinations) denies  Speech: Normal Gait: normal RESPIRATORY ASSESSMENT Even  Unlabored respirations  CARDIOVASCULAR ASSESSMENT Pulses equal   regular rate  Skin warm and dry   GASTROINTESTINAL ASSESSMENT no GI complaint EXTREMITIES Full ROM  PLAN OF CARE Provide calm/safe environment. Vital signs assessed twice daily. ED BHU Assessment once each 12-hour shift. Collaborate with TTS daily or as condition indicates. Assure the ED provider has rounded once each shift. Provide and encourage hygiene. Provide redirection as needed. Assess for escalating behavior; address immediately and inform ED provider.  Assess family dynamic and appropriateness for visitation as needed: Yes.  ; If necessary, describe findings:  Educate the patient/family about BHU procedures/visitation: Yes.  ; If necessary, describe findings:

## 2018-07-14 NOTE — BH Assessment (Signed)
Patient is to be admitted to [facility] by Dr. Viviano Simas.  Attending Physician will be Dr. Jennet Maduro.   Patient has been assigned to room 319-B, by [facility] Charge Nurse Gwen.   Intake Paper Work has been signed and placed on patient chart.  ER staff is aware of the admission:  Misty Stanley, ER Secretary    Dr. Scotty Court, ER MD   Amy, Patient's Nurse   Mosie Epstein, Patient Access.

## 2018-07-14 NOTE — ED Notes (Signed)

## 2018-07-14 NOTE — Tx Team (Signed)
Initial Treatment Plan 07/14/2018 6:01 PM Candice Lewis [id number]    PATIENT STRESSORS: Financial difficulties Medication change or noncompliance Substance abuse   PATIENT STRENGTHS: Ability for insight Average or above average intelligence Capable of independent living Communication skills Supportive family/friends   PATIENT IDENTIFIED PROBLEMS: Suicidal 07/14/2018  Depression  07/14/2018                   DISCHARGE CRITERIA:  Ability to meet basic life and health needs Improved stabilization in mood, thinking, and/or behavior  PRELIMINARY DISCHARGE PLAN: Outpatient therapy Return to previous living arrangement  PATIENT/FAMILY INVOLVEMENT: This treatment plan has been presented to and reviewed with the patient, Candice Lewis, and/or family member,   The patient and family have been given the opportunity to ask questions and make suggestions.  Crist Infante, RN 07/14/2018, 6:01 PM

## 2018-07-15 DIAGNOSIS — Z8669 Personal history of other diseases of the nervous system and sense organs: Secondary | ICD-10-CM

## 2018-07-15 DIAGNOSIS — T39011A Poisoning by aspirin, accidental (unintentional), initial encounter: Secondary | ICD-10-CM

## 2018-07-15 MED ORDER — SUMATRIPTAN SUCCINATE 50 MG PO TABS
50.0000 mg | ORAL_TABLET | ORAL | Status: DC | PRN
  Administered 2018-07-15: 50 mg via ORAL
  Filled 2018-07-15: qty 1

## 2018-07-15 MED ORDER — SUMATRIPTAN SUCCINATE 50 MG PO TABS: 50.0000 mg | ORAL_TABLET | ORAL | 0 refills | Status: DC | PRN

## 2018-07-15 NOTE — Progress Notes (Signed)
D: Patient is aware of  Discharge this shift .Patient denies suicidal /homicidal ideations. Patient received all belongings brought in   A: No Storage medications. Writer reviewed Discharge Summary, Suicide Risk Assessment, and Transitional Record. Patient also received Prescriptions   from  MD. A. Aware  Of follow up appointment   R: Patient left unit with no questions  Or concerns  With grandmother

## 2018-07-15 NOTE — H&P (Signed)
Psychiatric Admission Assessment Adult  Patient Identification: Candice Lewis MRN:  [id number] Date of Evaluation:  07/15/2018 Chief Complaint:  Depression Principal Diagnosis: Unintentional poisoning by aspirin Diagnosis:  Principal Problem:   Unintentional poisoning by aspirin Active Problems:   Cannabis use disorder, moderate, dependence (HCC)   History of migraine headaches  History of Present Illness:   Ms. Candice Lewis is a 23 year old female with no past psychiatric history.  Chief complaint. "I was trying to match miligrams of aspirin to take for my headache."  History of present illness. Information was obtained from the patient and the chart. The patient was brought to the ER by EMS for vomiting. The patient sufferes migraine headaches that usually get better with 1000 mg of Tylenol. On the night of admission, the patient had just one pill of 500 mg Tylenol left and was trying to match 1000 mg of Tylenol with 1000 mg of ASA which she believed was the right dose. She does not like swallowing pills so she put baby aspirins in her drink. She started vomiting and came to the hospital. The patient adamantly denies problems with depression and has never seek attention for it. She did lost her mother, who died in MVA, at the age of 19 but believes that she has been coping with her loss well. She denies psychosis or heightened anxiety. She denies substances but is positive for cannabis.  Past psychiatric history. Mild depression since she lost her mother. Never treated, admitted or suicidal.  Family psychiatric history. None.  Social history. Lives with her 36-year-old son and her grandmother. Enrolled in [facility]. Migraine headaches have been a problem for years. She has been tried on multiple medication at [facility] with no success.   Total Time spent with patient: 1 hour  Is the patient at risk to self? No.  Has the patient been a risk to self in the past 6 months? No.  Has the patient  been a risk to self within the distant past? No.  Is the patient a risk to others? No.  Has the patient been a risk to others in the past 6 months? No.  Has the patient been a risk to others within the distant past? No.   Prior Inpatient Therapy:   Prior Outpatient Therapy:    Alcohol Screening: 1. How often do you have a drink containing alcohol?: Never 2. How many drinks containing alcohol do you have on a typical day when you are drinking?: 1 or 2 3. How often do you have six or more drinks on one occasion?: Never AUDIT-C Score: 0 4. How often during the last year have you found that you were not able to stop drinking once you had started?: Never 5. How often during the last year have you failed to do what was normally expected from you becasue of drinking?: Never 6. How often during the last year have you needed a first drink in the morning to get yourself going after a heavy drinking session?: Never 7. How often during the last year have you had a feeling of guilt of remorse after drinking?: Never 8. How often during the last year have you been unable to remember what happened the night before because you had been drinking?: Never 9. Have you or someone else been injured as a result of your drinking?: No 10. Has a relative or friend or a doctor or another health worker been concerned about your drinking or suggested you cut down?: No Alcohol Use  Disorder Identification Test Final Score (AUDIT): 0 Alcohol Brief Interventions/Follow-up: Alcohol Education Substance Abuse History in the last 12 months:  Yes.   Consequences of Substance Abuse: Negative Previous Psychotropic Medications: No  Psychological Evaluations: No  Past Medical History:  Past Medical History:  Diagnosis Date  . Asthma    WELL CONTROLLED-NO INHALERS CURRENTLY   . Gallstones   . GERD (gastroesophageal reflux disease)    OCC-NO MEDS  . Migraine   . Migraine   . Seizures (HCC)    POSSIBLE AT AGE 60 OR 14 X 1-PT  STATES UNSURE IF IT WAS A DEFINITE SEIZURE    Past Surgical History:  Procedure Laterality Date  . CHOLECYSTECTOMY N/A 03/24/2017   Procedure: LAPAROSCOPIC CHOLECYSTECTOMY;  Surgeon: Ricarda Frame, MD;  Location: [facility] ORS;  Service: General;  Laterality: N/A;  . WISDOM TOOTH EXTRACTION     Family History:  Family History  Problem Relation Age of Onset  . Gallstones Mother    Tobacco Screening: Have you used any form of tobacco in the last 30 days? (Cigarettes, Smokeless Tobacco, Cigars, and/or Pipes): No Social History:  Social History   Substance and Sexual Activity  Alcohol Use No     Social History   Substance and Sexual Activity  Drug Use No    Additional Social History:                           Allergies:   Allergies  Allergen Reactions  . Zofran [Ondansetron Hcl] Other (See Comments)    AMS  . Latex Rash   Lab Results:  Results for orders placed or performed during the hospital encounter of 07/13/18 (from the past 48 hour(s))  CBC with Differential     Status: Abnormal   Collection Time: 07/13/18  6:15 PM  Result Value Ref Range   WBC 5.6 4.0 - 10.5 K/uL   RBC 5.82 (H) 3.87 - 5.11 MIL/uL   Hemoglobin 14.1 12.0 - 15.0 g/dL   HCT 99.3 71.6 - 96.7 %   MCV 78.0 (L) 80.0 - 100.0 fL   MCH 24.2 (L) 26.0 - 34.0 pg   MCHC 31.1 30.0 - 36.0 g/dL   RDW 89.3 (H) 81.0 - 17.5 %   Platelets 332 150 - 400 K/uL   nRBC 0.0 0.0 - 0.2 %   Neutrophils Relative % 64 %   Neutro Abs 3.6 1.7 - 7.7 K/uL   Lymphocytes Relative 26 %   Lymphs Abs 1.4 0.7 - 4.0 K/uL   Monocytes Relative 9 %   Monocytes Absolute 0.5 0.1 - 1.0 K/uL   Eosinophils Relative 1 %   Eosinophils Absolute 0.0 0.0 - 0.5 K/uL   Basophils Relative 0 %   Basophils Absolute 0.0 0.0 - 0.1 K/uL   Immature Granulocytes 0 %   Abs Immature Granulocytes 0.02 0.00 - 0.07 K/uL    Comment: Performed at [facility], [address] [place], [place] [address]  Comprehensive metabolic panel      Status: Abnormal   Collection Time: 07/13/18  6:15 PM  Result Value Ref Range   Sodium 140 135 - 145 mmol/L   Potassium 4.2 3.5 - 5.1 mmol/L   Chloride 106 98 - 111 mmol/L   CO2 26 22 - 32 mmol/L   Glucose, Bld 86 70 - 99 mg/dL   BUN 13 6 - 20 mg/dL   Creatinine, Ser 5.27 (H) 0.44 - 1.00 mg/dL   Calcium 9.2 8.9 -  10.3 mg/dL   Total Protein 7.8 6.5 - 8.1 g/dL   Albumin 4.1 3.5 - 5.0 g/dL   AST 21 15 - 41 U/L   ALT 15 0 - 44 U/L   Alkaline Phosphatase 97 38 - 126 U/L   Total Bilirubin 0.6 0.3 - 1.2 mg/dL   GFR calc non Af Amer >60 >60 mL/min   GFR calc Af Amer >60 >60 mL/min   Anion gap 8 5 - 15    Comment: Performed at [facility], [address] [place], [place] [address]  Urinalysis, Complete w Microscopic     Status: Abnormal   Collection Time: 07/13/18  6:15 PM  Result Value Ref Range   Color, Urine STRAW (A) YELLOW   APPearance CLEAR (A) CLEAR   Specific Gravity, Urine 1.012 1.005 - 1.030   pH 7.0 5.0 - 8.0   Glucose, UA NEGATIVE NEGATIVE mg/dL   Hgb urine dipstick NEGATIVE NEGATIVE   Bilirubin Urine NEGATIVE NEGATIVE   Ketones, ur NEGATIVE NEGATIVE mg/dL   Protein, ur NEGATIVE NEGATIVE mg/dL   Nitrite NEGATIVE NEGATIVE   Leukocytes,Ua NEGATIVE NEGATIVE   RBC / HPF 0-5 0 - 5 RBC/hpf   WBC, UA 0-5 0 - 5 WBC/hpf   Bacteria, UA NONE SEEN NONE SEEN   Squamous Epithelial / LPF 0-5 0 - 5   Mucus PRESENT     Comment: Performed at [facility], [address] [place], [place] [address]  Urine Drug Screen, Qualitative ([facility] only)     Status: Abnormal   Collection Time: 07/13/18  6:15 PM  Result Value Ref Range   Tricyclic, Ur Screen NONE DETECTED NONE DETECTED   Amphetamines, Ur Screen NONE DETECTED NONE DETECTED   MDMA (Ecstasy)Ur Screen NONE DETECTED NONE DETECTED   Cocaine Metabolite,Ur [place] NONE DETECTED NONE DETECTED   Opiate, Ur Screen NONE DETECTED NONE DETECTED   Phencyclidine (PCP) Ur S NONE DETECTED NONE DETECTED   Cannabinoid 50 Ng, Ur [place]  POSITIVE (A) NONE DETECTED   Barbiturates, Ur Screen NONE DETECTED NONE DETECTED   Benzodiazepine, Ur Scrn NONE DETECTED NONE DETECTED   Methadone Scn, Ur NONE DETECTED NONE DETECTED    Comment: (NOTE) Tricyclics + metabolites, urine    Cutoff 1000 ng/mL Amphetamines + metabolites, urine  Cutoff 1000 ng/mL MDMA (Ecstasy), urine              Cutoff 500 ng/mL Cocaine Metabolite, urine          Cutoff 300 ng/mL Opiate + metabolites, urine        Cutoff 300 ng/mL Phencyclidine (PCP), urine         Cutoff 25 ng/mL Cannabinoid, urine                 Cutoff 50 ng/mL Barbiturates + metabolites, urine  Cutoff 200 ng/mL Benzodiazepine, urine              Cutoff 200 ng/mL Methadone, urine                   Cutoff 300 ng/mL The urine drug screen provides only a preliminary, unconfirmed analytical test result and should not be used for non-medical purposes. Clinical consideration and professional judgment should be applied to any positive drug screen result due to possible interfering substances. A more specific alternate chemical method must be used in order to obtain a confirmed analytical result. Gas chromatography / mass spectrometry (GC/MS) is the preferred confirmat ory method. Performed at [facility], [phone]  [address] [place], [place] [address]   Acetaminophen level     Status: Abnormal   Collection Time: 07/13/18  6:51 PM  Result Value Ref Range   Acetaminophen (Tylenol), Serum <10 (L) 10 - 30 ug/mL    Comment: (NOTE) Therapeutic concentrations vary significantly. A range of 10-30 ug/mL  may be an effective concentration for many patients. However, some  are best treated at concentrations outside of this range. Acetaminophen concentrations >150 ug/mL at 4 hours after ingestion  and >50 ug/mL at 12 hours after ingestion are often associated with  toxic reactions. Performed at [facility], [address] [place], [place] [address]   Ethanol     Status: None    Collection Time: 07/13/18  6:51 PM  Result Value Ref Range   Alcohol, Ethyl (B) <10 <10 mg/dL    Comment: (NOTE) Lowest detectable limit for serum alcohol is 10 mg/dL. For medical purposes only. Performed at [facility], [address] [place], [place] [address]   Salicylate level     Status: None   Collection Time: 07/13/18  6:51 PM  Result Value Ref Range   Salicylate Lvl <7.0 2.8 - 30.0 mg/dL    Comment: Performed at [facility], [address] [place], [place] [address]  Pregnancy, urine POC     Status: None   Collection Time: 07/13/18  6:57 PM  Result Value Ref Range   Preg Test, Ur NEGATIVE NEGATIVE    Comment:        THE SENSITIVITY OF THIS METHODOLOGY IS >24 mIU/mL   Salicylate level     Status: None   Collection Time: 07/13/18 10:11 PM  Result Value Ref Range   Salicylate Lvl <7.0 2.8 - 30.0 mg/dL    Comment: Performed at [facility], [address] [place], [place] [address]    Blood Alcohol level:  Lab Results  Component Value Date   [facility] <10 07/13/2018    Metabolic Disorder Labs:  No results found for: HGBA1C, MPG No results found for: PROLACTIN No results found for: CHOL, TRIG, HDL, CHOLHDL, VLDL, LDLCALC  Current Medications: Current Facility-Administered Medications  Medication Dose Route Frequency Provider Last Rate Last Dose  . acetaminophen (TYLENOL) tablet 650 mg  650 mg Oral Q6H PRN Mariel CraftMaurer, Sheila M, MD      . alum & mag hydroxide-simeth (MAALOX/MYLANTA) 200-200-20 MG/5ML suspension 30 mL  30 mL Oral Q4H PRN Mariel CraftMaurer, Sheila M, MD      . hydrOXYzine (ATARAX/VISTARIL) tablet 25 mg  25 mg Oral Q6H PRN Mariel CraftMaurer, Sheila M, MD      . magnesium hydroxide (MILK OF MAGNESIA) suspension 30 mL  30 mL Oral Daily PRN Mariel CraftMaurer, Sheila M, MD      . SUMAtriptan (IMITREX) tablet 50 mg  50 mg Oral Q2H PRN Hal Norrington B, MD       PTA Medications: No medications prior to admission.    Musculoskeletal: Strength & Muscle Tone:  within normal limits Gait & Station: normal Patient leans: N/A  Psychiatric Specialty Exam: Physical Exam  Nursing note and vitals reviewed. Constitutional: She is oriented to person, place, and time. She appears well-developed and well-nourished.  HENT:  Head: Normocephalic and atraumatic.  Eyes: Pupils are equal, round, and reactive to light. Conjunctivae are normal.  Neck: Normal range of motion. Neck supple.  Cardiovascular: Normal rate, regular rhythm and normal heart sounds.  Respiratory: Effort normal and breath sounds normal.  GI: Soft.  Musculoskeletal: Normal range of  motion.  Neurological: She is alert and oriented to person, place, and time.  Skin: Skin is warm and dry.  Psychiatric: She has a normal mood and affect. Her speech is normal and behavior is normal. Judgment and thought content normal. Cognition and memory are normal.    Review of Systems  Neurological: Positive for headaches.  Psychiatric/Behavioral: Negative.   All other systems reviewed and are negative.   Blood pressure 127/82, pulse 93, temperature 98.5 F (36.9 C), temperature source Oral, resp. rate 18, height 5' (1.524 m), weight 90.7 kg, last menstrual period 06/22/2018, SpO2 98 %.Body mass index is 39.06 kg/m.  See SRA                                                  Sleep:  Number of Hours: 6.15    Treatment Plan Summary: Daily contact with patient to assess and evaluate symptoms and progress in treatment and Medication management   Ms. Trama is a 23 year old female with a history of mild depression admitted after unintentional overdose on ASA. The patient adamantly denies any thoughts, intention or plans to hurt herself or others. She is able to contract for safety. She is forward thinking and optimistic about the future. She is a loving mother and granddaughter.  #Suicidal ideation -patient is able to contract for safety in the hospital  #Mood -denies depressive  symptoms -declines pharmacotherapy  #Migraine headache -Imitrex 50 mg PRN  #Cannabis abuse -minimizes problem and declines treatment  #Labs -pregnancy test negative  #Disposition -discharge to home with family -follow up with [facility]   Observation Level/Precautions:  15 minute checks  Laboratory:  CBC Chemistry Profile UDS UA  Psychotherapy:    Medications:    Consultations:    Discharge Concerns:    Estimated LOS:  Other:     Physician Treatment Plan for Primary Diagnosis: Unintentional poisoning by aspirin Long Term Goal(s): Improvement in symptoms so as ready for discharge  Short Term Goals: Ability to identify changes in lifestyle to reduce recurrence of condition will improve, Ability to verbalize feelings will improve, Ability to disclose and discuss suicidal ideas, Ability to demonstrate self-control will improve, Ability to identify and develop effective coping behaviors will improve, Ability to maintain clinical measurements within normal limits will improve, Compliance with prescribed medications will improve and Ability to identify triggers associated with substance abuse/mental health issues will improve  Physician Treatment Plan for Secondary Diagnosis: Principal Problem:   Unintentional poisoning by aspirin Active Problems:   Cannabis use disorder, moderate, dependence (HCC)   History of migraine headaches  Long Term Goal(s): Improvement in symptoms so as ready for discharge  Short Term Goals: Ability to identify changes in lifestyle to reduce recurrence of condition will improve, Ability to demonstrate self-control will improve and Ability to identify triggers associated with substance abuse/mental health issues will improve  I certify that inpatient services furnished can reasonably be expected to improve the patient's condition.    Kristine Linea, MD 2/20/202010:08 AM

## 2018-07-15 NOTE — BHH Counselor (Addendum)
Pt is scheduled for discharge today(07/15/2018) leaving within 24hrs, PSA not required at this time. Pt states she does not want to be referred for any outpatient services at this time. Pt denies any hx of SI/HI. Pt denies any hx of psychiatric tx.

## 2018-07-15 NOTE — [facility] Suicide Risk Assessment (Signed)
[facility] Discharge Suicide Risk Assessment   Principal Problem: Unintentional poisoning by aspirin Discharge Diagnoses: Principal Problem:   Unintentional poisoning by aspirin Active Problems:   Cannabis use disorder, moderate, dependence (HCC)   History of migraine headaches   Total Time spent with patient: 1 hour  Musculoskeletal: Strength & Muscle Tone: within normal limits Gait & Station: normal Patient leans: N/A  Psychiatric Specialty Exam: Review of Systems  Neurological: Positive for headaches.  Psychiatric/Behavioral: Negative.   All other systems reviewed and are negative.   Blood pressure 127/82, pulse 93, temperature 98.5 F (36.9 C), temperature source Oral, resp. rate 18, height 5' (1.524 m), weight 90.7 kg, last menstrual period 06/22/2018, SpO2 98 %.Body mass index is 39.06 kg/m.  General Appearance: Casual  Eye Contact::  Good  Speech:  Clear and Coherent409  Volume:  Normal  Mood:  Euthymic  Affect:  Appropriate  Thought Process:  Goal Directed and Descriptions of Associations: Intact  Orientation:  Full (Time, Place, and Person)  Thought Content:  WDL  Suicidal Thoughts:  No  Homicidal Thoughts:  No  Memory:  Immediate;   Fair Recent;   Fair Remote;   Fair  Judgement:  Impaired  Insight:  Present  Psychomotor Activity:  Normal  Concentration:  Fair  Recall:  [employer] of Knowledge:Fair  Language: Fair  Akathisia:  No  Handed:  Right  AIMS (if indicated):     Assets:  Communication Skills Desire for Improvement Financial Resources/Insurance Housing Physical Health Resilience Social Support Transportation Vocational/Educational  Sleep:  Number of Hours: 6.15  Cognition: WNL  ADL's:  Intact   Mental Status Per Nursing Assessment::   On Admission:  NA  Demographic Factors:  NA  Loss Factors: NA  Historical Factors: NA  Risk Reduction Factors:   Responsible for children under 75 years of age, Sense of responsibility to family,  Living with another person, especially a relative, Positive social support and Positive coping skills or problem solving skills  Continued Clinical Symptoms:  Chronic Pain  Cognitive Features That Contribute To Risk:  None    Suicide Risk:  Minimal: No identifiable suicidal ideation.  Patients presenting with no risk factors but with morbid ruminations; may be classified as minimal risk based on the severity of the depressive symptoms  Follow-up Information    [facility] Follow up on 07/21/2018.   Why:  Meet Unk Pinto Wednesday 07/21/2018 at [facility] at 7:15 AM for peer support services. Thank you! Contact information: [address] [place] [phone]           Plan Of Care/Follow-up recommendations:  Activity:  as tolerated Diet:  regular Other:  keep follow up appointments  Kristine Linea, MD 07/15/2018, 10:23 AM

## 2018-07-15 NOTE — Progress Notes (Signed)
patient is alert and oriented, stable and adjusting well  In the unit, seen in the milieu area with peers in social communications and playing cards with some unite  scheduled activities.patient present no concerns, patient is pleasant upon approach , compliant with her medicines and therapy.  Patient is aware of her coping skills no  Out burst or violent behaviors, encouraged to let me know if need arise , appetite is good, well hydrated with fluids and juices, mood is bright and affect is congruent  With her mood, medication compliant and doing well with therapy.  Patient verbally contract for safety of self and others, patient is able to voice positive attributes of self , denies any SI/HI/AVH, patient is sleeping restfully without distress and 15 minutes safety rounding is maintained.

## 2018-07-15 NOTE — Progress Notes (Signed)
  [facility] Adult Case Management Discharge Plan :  Will you be returning to the same living situation after discharge:  Yes,  pt lives with grandmother At discharge, do you have transportation home?: Yes,  grandmother can assist with transportation Do you have the ability to pay for your medications: No. pt has no insurance  Release of information consent forms completed and in the chart;  Patient's signature needed at discharge.  Patient to Follow up at: Follow-up Information    [facility] Follow up on 07/21/2018.   Why:  If you change your mind about receiving services please meet Unk Pinto on Wednesday, July 21, 2018 at [facility] at 7:15 AM for peer support services. Thank you! Contact information: [address] [place] [address] [phone]           Next level of care provider has access to [facility] Link:no  Safety Planning and Suicide Prevention discussed: Yes,  with pt; pt declined family contact  Have you used any form of tobacco in the last 30 days? (Cigarettes, Smokeless Tobacco, Cigars, and/or Pipes): No  Has patient been referred to the Quitline?: N/A patient is not a smoker  Patient has been referred for addiction treatment: N/A  Suzan Slick, LCSW 07/15/2018, 11:32 AM

## 2018-07-15 NOTE — Progress Notes (Signed)
Recreation Therapy Notes  Date: 07/15/2018  Time: 9:30 am   Location: Craft room   Behavioral response: N/A   Intervention Topic: Communication  Discussion/Intervention: Patient did not attend group.   Clinical Observations/Feedback:  Patient did not attend group.   Candice Lewis LRT/CTRS        Candice Lewis 07/15/2018 11:02 AM 

## 2018-07-15 NOTE — [facility] Suicide Risk Assessment (Signed)
[facility] INPATIENT:  Family/Significant Other Suicide Prevention Education  Suicide Prevention Education:  Patient Refusal for Family/Significant Other Suicide Prevention Education: The patient Candice Lewis has refused to provide written consent for family/significant other to be provided Family/Significant Other Suicide Prevention Education during admission and/or prior to discharge.  Physician notified.  Timia Casselman T Waynette Towers 07/15/2018, 11:17 AM

## 2018-07-15 NOTE — Discharge Summary (Signed)
Physician Discharge Summary Note  Patient:  Candice Lewis is an 23 y.o., female MRN:  [id number] DOB:  01/02/1996 Patient phone:  [phone] (home)  Patient address:   [address] [place] [id number],  Total Time spent with patient: 1 hour  Date of Admission:  07/14/2018 Date of Discharge: 07/15/2018  Reason for Admission:  Overdose.  History of Present Illness:   Ms. Candice Lewis is a 23 year old female with no past psychiatric history.  Chief complaint. "I was trying to match miligrams of aspirin to take for my headache."  History of present illness. Information was obtained from the patient and the chart. The patient was brought to the ER by EMS for vomiting. The patient sufferes migraine headaches that usually get better with 1000 mg of Tylenol. On the night of admission, the patient had just one pill of 500 mg Tylenol left and was trying to match 1000 mg of Tylenol with 1000 mg of ASA which she believed was the right dose. She does not like swallowing pills so she put baby aspirins in her drink. She started vomiting and came to the hospital. The patient adamantly denies problems with depression and has never seek attention for it. She did lost her mother, who died in MVA, at the age of 567 but believes that she has been coping with her loss well. She denies psychosis or heightened anxiety. She denies substances but is positive for cannabis.  Past psychiatric history. Mild depression since she lost her mother. Never treated, admitted or suicidal.  Family psychiatric history. None.  Social history. Lives with her 22-year-old son and her grandmother. [facility]. Migraine headaches have been a problem for years. She has been [facility] with no success.   Principal Problem: Unintentional poisoning by aspirin Discharge Diagnoses: Principal Problem:   Unintentional poisoning by aspirin Active Problems:   Cannabis use disorder, moderate,  dependence (HCC)   History of migraine headaches  Past Medical History:  Past Medical History:  Diagnosis Date  . Asthma    WELL CONTROLLED-NO INHALERS CURRENTLY   . Gallstones   . GERD (gastroesophageal reflux disease)    OCC-NO MEDS  . Migraine   . Migraine   . Seizures (HCC)    POSSIBLE AT AGE 5 OR 14 X 1-PT STATES UNSURE IF IT WAS A DEFINITE SEIZURE    Past Surgical History:  Procedure Laterality Date  . CHOLECYSTECTOMY N/A 03/24/2017   Procedure: LAPAROSCOPIC CHOLECYSTECTOMY;  Surgeon: Ricarda FrameWoodham, Charles, MD;  Location: [facility] ORS;  Service: General;  Laterality: N/A;  . WISDOM TOOTH EXTRACTION     Family History:  Family History  Problem Relation Age of Onset  . Gallstones Mother    Social History:  Social History   Substance and Sexual Activity  Alcohol Use No     Social History   Substance and Sexual Activity  Drug Use No    Social History   Socioeconomic History  . Marital status: Single    Spouse name: Not on file  . Number of children: Not on file  . Years of education: Not on file  . Highest education level: Not on file  Occupational History  . Not on file  Social Needs  . Financial resource strain: Not hard at all  . Food insecurity:    Worry: Never true    Inability: Never true  . Transportation needs:    Medical: No    Non-medical: No  Tobacco Use  .  Smoking status: Never Smoker  . Smokeless tobacco: Never Used  Substance and Sexual Activity  . Alcohol use: No  . Drug use: No  . Sexual activity: Not Currently    Birth control/protection: None  Lifestyle  . Physical activity:    Days per week: 7 days    Minutes per session: Not on file  . Stress: Only a little  Relationships  . Social connections:    Talks on phone: More than three times a week    Gets together: More than three times a week    Attends religious service: Never    Active member of club or organization: No    Attends meetings of clubs or organizations: Never     Relationship status: Never married  Other Topics Concern  . Not on file  Social History Narrative  . Not on file    Hospital Course:    Ms. Candice Lewis is a 23 year old female with a history of mild depression admitted after unintentional overdose on ASA. The patient adamantly denies any thoughts, intention or plans to hurt herself or others. She is able to contract for safety. She is forward thinking and optimistic about the future. She is a loving mother and granddaughter.  #Mood -denies depressive symptoms -declines pharmacotherapy  #Migraine headache -Imitrex 50 mg PRN  #Cannabis abuse -minimizes problem and declines treatment  #Labs -pregnancy test negative  #Disposition -discharge to home with family -follow up with [facility]  Physical Findings: AIMS: Facial and Oral Movements Muscles of Facial Expression: None, normal Lips and Perioral Area: None, normal Jaw: None, normal Tongue: None, normal,Extremity Movements Upper (arms, wrists, hands, fingers): None, normal Lower (legs, knees, ankles, toes): None, normal, Trunk Movements Neck, shoulders, hips: None, normal, Overall Severity Severity of abnormal movements (highest score from questions above): None, normal Incapacitation due to abnormal movements: None, normal Patient's awareness of abnormal movements (rate only patient's report): No Awareness, Dental Status Current problems with teeth and/or dentures?: No Does patient usually wear dentures?: No  CIWA:  CIWA-Ar Total: 0 COWS:     Musculoskeletal: Strength & Muscle Tone: within normal limits Gait & Station: normal Patient leans: N/A  Psychiatric Specialty Exam: Physical Exam  Nursing note and vitals reviewed. Psychiatric: She has a normal mood and affect. Her speech is normal and behavior is normal. Thought content normal. Cognition and memory are normal. She expresses impulsivity.    Review of Systems  Neurological: Positive for headaches.   Psychiatric/Behavioral: Negative.   All other systems reviewed and are negative.   Blood pressure 127/82, pulse 93, temperature 98.5 F (36.9 C), temperature source Oral, resp. rate 18, height 5' (1.524 m), weight 90.7 kg, last menstrual period 06/22/2018, SpO2 98 %.Body mass index is 39.06 kg/m.  General Appearance: Casual  Eye Contact:  Good  Speech:  Clear and Coherent  Volume:  Normal  Mood:  Euthymic  Affect:  Appropriate  Thought Process:  Goal Directed and Descriptions of Associations: Intact  Orientation:  Full (Time, Place, and Person)  Thought Content:  WDL  Suicidal Thoughts:  No  Homicidal Thoughts:  No  Memory:  Immediate;   Fair Recent;   Fair Remote;   Fair  Judgement:  Impaired  Insight:  Present  Psychomotor Activity:  Normal  Concentration:  Concentration: Fair and Attention Span: Fair  Recall:  [employer] of Knowledge:  Fair  Language:  Fair  Akathisia:  No  Handed:  Right  AIMS (if indicated):     Assets:  Communication  Skills Desire for Improvement Housing Resilience Social Support Transportation Vocational/Educational  ADL's:  Intact  Cognition:  WNL  Sleep:  Number of Hours: 6.15     Have you used any form of tobacco in the last 30 days? (Cigarettes, Smokeless Tobacco, Cigars, and/or Pipes): No  Has this patient used any form of tobacco in the last 30 days? (Cigarettes, Smokeless Tobacco, Cigars, and/or Pipes) Yes, No  Blood Alcohol level:  Lab Results  Component Value Date   ETH <10 07/13/2018    Metabolic Disorder Labs:  No results found for: HGBA1C, MPG No results found for: PROLACTIN No results found for: CHOL, TRIG, HDL, CHOLHDL, VLDL, LDLCALC  See Psychiatric Specialty Exam and Suicide Risk Assessment completed by Attending Physician prior to discharge.  Discharge destination:  Home  Is patient on multiple antipsychotic therapies at discharge:  No   Has Patient had three or more failed trials of antipsychotic monotherapy by  history:  No  Recommended Plan for Multiple Antipsychotic Therapies: NA  Discharge Instructions    Diet - low sodium heart healthy   Complete by:  As directed    Increase activity slowly   Complete by:  As directed      Allergies as of 07/15/2018      Reactions   Zofran [ondansetron Hcl] Other (See Comments)   AMS   Latex Rash      Medication List    TAKE these medications     Indication  SUMAtriptan 50 MG tablet Commonly known as:  IMITREX Take 1 tablet (50 mg total) by mouth every 2 (two) hours as needed for migraine. May repeat in 2 hours if headache persists or recurs.  Indication:  Migraine Headache      Follow-up Information    [facility] Follow up on 07/21/2018.   Why:  Meet Unk Pinto Wednesday 07/21/2018 at [facility] at 7:15 AM for peer support services. Thank you! Contact information: [address] [place] [phone]           Follow-up recommendations:  Activity:  as tolerated Diet:  regular Other:  keep follow up appointments  Comments:    Signed: Kristine Linea, MD 07/15/2018, 10:26 AM

## 2019-01-01 ENCOUNTER — Other Ambulatory Visit: Payer: Self-pay

## 2019-01-01 ENCOUNTER — Encounter: Payer: Self-pay | Admitting: Emergency Medicine

## 2019-01-01 ENCOUNTER — Emergency Department
Admission: EM | Admit: 2019-01-01 | Discharge: 2019-01-01 | Disposition: A | Discharge status: Home / Self Care | Payer: Self-pay | Attending: Emergency Medicine | Admitting: Emergency Medicine

## 2019-01-01 DIAGNOSIS — J45909 Unspecified asthma, uncomplicated: Secondary | ICD-10-CM | POA: Insufficient documentation

## 2019-01-01 DIAGNOSIS — H1032 Unspecified acute conjunctivitis, left eye: Secondary | ICD-10-CM | POA: Insufficient documentation

## 2019-01-01 DIAGNOSIS — Z20828 Contact with and (suspected) exposure to other viral communicable diseases: Secondary | ICD-10-CM | POA: Insufficient documentation

## 2019-01-01 DIAGNOSIS — J029 Acute pharyngitis, unspecified: Secondary | ICD-10-CM | POA: Insufficient documentation

## 2019-01-01 DIAGNOSIS — Z91040 Latex allergy status: Secondary | ICD-10-CM | POA: Insufficient documentation

## 2019-01-01 DIAGNOSIS — H1030 Unspecified acute conjunctivitis, unspecified eye: Secondary | ICD-10-CM

## 2019-01-01 LAB — CHLAMYDIA/NGC RT PCR (ARMC ONLY)
Chlamydia Tr: NOT DETECTED
N gonorrhoeae: NOT DETECTED

## 2019-01-01 LAB — GROUP A STREP BY PCR: Group A Strep by PCR: NOT DETECTED

## 2019-01-01 MED ORDER — ERYTHROMYCIN 5 MG/GM OP OINT: 1.0000 "application " | TOPICAL_OINTMENT | Freq: Every day | OPHTHALMIC | 0 refills | Status: AC

## 2019-01-01 NOTE — ED Notes (Signed)
Pt states pink eye that started yesterday and felt like her tonsils were swollen today.

## 2019-01-01 NOTE — ED Provider Notes (Signed)
[facility] Emergency Department Provider Note  ____________________________________________  Time seen: Approximately 5:43 PM  I have reviewed the triage vital signs and the nursing notes.   HISTORY  Chief Complaint Conjunctivitis and Sore Throat    HPI Candice Lewis is a 23 y.o. female presents to the emergency department with left eye conjunctivitis and pharyngitis for the past 2 days.  Patient also states that she has had headache and body aches.  She denies fever at home and chills.  No cough, chest tightness or shortness of breath.  Patient states that she "passed out" last night.  Patient states that for her, this description involves falling asleep on the couch while watching TV.  She denies changes in vaginal discharge or dysuria.  No other alleviating measures have been attempted.        Past Medical History:  Diagnosis Date  . Asthma    WELL CONTROLLED-NO INHALERS CURRENTLY   . Gallstones   . GERD (gastroesophageal reflux disease)    OCC-NO MEDS  . Migraine   . Migraine   . Seizures ([place])    POSSIBLE AT AGE 89 OR 14 X 1-PT STATES UNSURE IF IT WAS A DEFINITE SEIZURE    Patient Active Problem List   Diagnosis Date Noted  . History of migraine headaches 07/15/2018  . Unintentional poisoning by aspirin 07/14/2018  . Cannabis use disorder, moderate, dependence ([place]) 07/14/2018  . Biliary colic   . Abdominal pain, right upper quadrant 03/09/2017  . Positive GBS test 01/09/2017  . Pregnancy 12/17/2016  . Obesity (BMI 30.0-34.9) 06/23/2016  . Spontaneous vaginal delivery 06/23/2016  . Seizures ([place]) 06/20/2016  . Asthma 01/10/2013  . Migraine with aura 01/10/2013    Past Surgical History:  Procedure Laterality Date  . CHOLECYSTECTOMY N/A 03/24/2017   Procedure: LAPAROSCOPIC CHOLECYSTECTOMY;  Surgeon: Clayburn Pert, MD;  Location: [facility] ORS;  Service: General;  Laterality: N/A;  . WISDOM TOOTH EXTRACTION      Prior to Admission  medications   Medication Sig Start Date End Date Taking? Authorizing Provider  erythromycin ophthalmic ointment Place 1 application into the left eye at bedtime for 7 days. 01/01/19 01/08/19  Lannie Fields, PA-C  SUMAtriptan (IMITREX) 50 MG tablet Take 1 tablet (50 mg total) by mouth every 2 (two) hours as needed for migraine. May repeat in 2 hours if headache persists or recurs. 07/15/18   Clovis Fredrickson, MD    Allergies Zofran Alvis Lemmings hcl] and Latex  Family History  Problem Relation Age of Onset  . Gallstones Mother     Social History Social History   Tobacco Use  . Smoking status: Never Smoker  . Smokeless tobacco: Never Used  Substance Use Topics  . Alcohol use: No  . Drug use: No     Review of Systems  Constitutional: No fever/chills Eyes: Patient has left eye conjunctivitis.  No discharge ENT: Patient has pharyngitis. Cardiovascular: no chest pain. Respiratory: no cough. No SOB. Gastrointestinal: No abdominal pain.  No nausea, no vomiting.  No diarrhea.  No constipation. Genitourinary: Negative for dysuria. No hematuria Musculoskeletal: Negative for musculoskeletal pain. Skin: Negative for rash, abrasions, lacerations, ecchymosis. Neurological: Patient has headache, no focal weakness or numbness.   ____________________________________________   PHYSICAL EXAM:  VITAL SIGNS: ED Triage Vitals [01/01/19 1450]  Enc Vitals Group     BP 119/81     Pulse Rate 84     Resp 14     Temp 99.8 F (37.7 C)  Temp Source Oral     SpO2 98 %     Weight 200 lb (90.7 kg)     Height 5' (1.524 m)     Head Circumference      Peak Flow      Pain Score 8     Pain Loc      Pain Edu?      Excl. in GC?      Constitutional: Alert and oriented. Well appearing and in no acute distress. Eyes: Conjunctivae are normal. PERRL. EOMI. Head: Atraumatic. ENT:      Ears: TMs are injected.       Nose: No congestion/rhinnorhea.      Mouth/Throat: Mucous membranes are  moist.  Posterior pharynx is erythematous.  No significant tonsillar hypertrophy or exudate.  Uvula is midline. Neck: No stridor.  No cervical spine tenderness to palpation. Hematological/Lymphatic/Immunilogical: No cervical lymphadenopathy. Cardiovascular: Normal rate, regular rhythm. Normal S1 and S2.  Good peripheral circulation. Respiratory: Normal respiratory effort without tachypnea or retractions. Lungs CTAB. Good air entry to the bases with no decreased or absent breath sounds. Gastrointestinal: Bowel sounds 4 quadrants. Soft and nontender to palpation. No guarding or rigidity. No palpable masses. No distention. No CVA tenderness. Musculoskeletal: Full range of motion to all extremities. No gross deformities appreciated. Neurologic:  Normal speech and language. No gross focal neurologic deficits are appreciated.  Skin:  Skin is warm, dry and intact. No rash noted. Psychiatric: Mood and affect are normal. Speech and behavior are normal. Patient exhibits appropriate insight and judgement.   ____________________________________________   LABS (all labs ordered are listed, but only abnormal results are displayed)  Labs Reviewed  GROUP A STREP BY PCR  CHLAMYDIA/NGC RT PCR ([facility] ONLY)  NOVEL CORONAVIRUS, NAA (HOSPITAL ORDER, SEND-OUT TO REF LAB)   ____________________________________________  EKG   ____________________________________________  RADIOLOGY   No results found.  ____________________________________________    PROCEDURES  Procedure(s) performed:    Procedures    Medications - No data to display   ____________________________________________   INITIAL IMPRESSION / ASSESSMENT AND PLAN / ED COURSE  Pertinent labs & imaging results that were available during my care of the patient were reviewed by me and considered in my medical decision making (see chart for details).  Review of the [place] CSRS was performed in accordance of the [facility] prior to  dispensing any controlled drugs.           Assessment and plan Conjunctivitis Pharyngitis 23 year old female presents to the emergency department with left eye conjunctivitis, pharyngitis, headache and body aches.  Patient was afebrile at triage.  Vital signs were otherwise within reference range.  Patient had an erythematous posterior pharynx and physical exam was otherwise reassuring.  Differential diagnosis includes group A strep, COVID-19, gonorrhea and chlamydia and unspecified viral URI.  Group A strep testing was negative.  COVID-19 testing is pending.  Gonorrhea and Chlamydia testing is pending as well.  Patient was cautioned that if her gonorrhea and Chlamydia testing is positive, she will need to return to the emergency department for treatment.  Patient was cautioned to remain quarantined in her home until COVID-19 results return.  Patient was advised to return to the emergency department with chest pain, chest tightness or worsening shortness of breath.  She voiced understanding.  All patient questions were answered.    ____________________________________________  FINAL CLINICAL IMPRESSION(S) / ED DIAGNOSES  Final diagnoses:  Pharyngitis, unspecified etiology  Acute conjunctivitis, unspecified acute conjunctivitis type, unspecified laterality  NEW MEDICATIONS STARTED DURING THIS VISIT:  ED Discharge Orders         Ordered    erythromycin ophthalmic ointment  Daily at bedtime     01/01/19 1732              This chart was dictated using voice recognition software/Dragon. Despite best efforts to proofread, errors can occur which can change the meaning. Any change was purely unintentional.    Orvil FeilWoods, Teandra Harlan M, PA-C 01/01/19 1752    Phineas SemenGoodman, Graydon, MD 01/03/19 [phone]

## 2019-01-01 NOTE — Discharge Instructions (Signed)
Take Tylenol for fever. Apply erythromycin nightly before bedtime. Return to the emergency department if gonorrhea and Chlamydia testing is positive for treatment. Stay quarantined in your home until COVID-19 results return.

## 2019-01-01 NOTE — ED Triage Notes (Signed)
Pt to ED from home c/o conjunctivitis to left eye started yesterday with red sclera and drainage.  Pt also c/o sore throat, enlarged tonsils with right larger than left.  Denies trouble breathing, speaking in complete and coherent sentences, in NAD at this time.

## 2019-01-02 LAB — SARS CORONAVIRUS 2 (TAT 6-24 HRS): SARS Coronavirus 2: NEGATIVE

## 2019-01-15 ENCOUNTER — Emergency Department: Payer: HRSA Program

## 2019-01-15 ENCOUNTER — Encounter: Payer: Self-pay | Admitting: Emergency Medicine

## 2019-01-15 ENCOUNTER — Emergency Department
Admission: EM | Admit: 2019-01-15 | Discharge: 2019-01-15 | Disposition: A | Discharge status: Home / Self Care | Payer: HRSA Program | Attending: Emergency Medicine | Admitting: Emergency Medicine

## 2019-01-15 ENCOUNTER — Other Ambulatory Visit: Payer: Self-pay

## 2019-01-15 DIAGNOSIS — U071 COVID-19: Secondary | ICD-10-CM | POA: Diagnosis not present

## 2019-01-15 DIAGNOSIS — J039 Acute tonsillitis, unspecified: Secondary | ICD-10-CM | POA: Diagnosis not present

## 2019-01-15 DIAGNOSIS — J45909 Unspecified asthma, uncomplicated: Secondary | ICD-10-CM | POA: Diagnosis not present

## 2019-01-15 DIAGNOSIS — G43909 Migraine, unspecified, not intractable, without status migrainosus: Secondary | ICD-10-CM | POA: Diagnosis not present

## 2019-01-15 DIAGNOSIS — Z888 Allergy status to other drugs, medicaments and biological substances status: Secondary | ICD-10-CM | POA: Insufficient documentation

## 2019-01-15 DIAGNOSIS — Z79899 Other long term (current) drug therapy: Secondary | ICD-10-CM | POA: Diagnosis not present

## 2019-01-15 DIAGNOSIS — R1310 Dysphagia, unspecified: Secondary | ICD-10-CM | POA: Insufficient documentation

## 2019-01-15 DIAGNOSIS — K219 Gastro-esophageal reflux disease without esophagitis: Secondary | ICD-10-CM | POA: Insufficient documentation

## 2019-01-15 DIAGNOSIS — J029 Acute pharyngitis, unspecified: Secondary | ICD-10-CM | POA: Diagnosis present

## 2019-01-15 DIAGNOSIS — R569 Unspecified convulsions: Secondary | ICD-10-CM | POA: Diagnosis not present

## 2019-01-15 LAB — URINALYSIS, COMPLETE (UACMP) WITH MICROSCOPIC
Bacteria, UA: NONE SEEN
Bilirubin Urine: NEGATIVE
Glucose, UA: NEGATIVE mg/dL
Hgb urine dipstick: NEGATIVE
Ketones, ur: 5 mg/dL — AB
Leukocytes,Ua: NEGATIVE
Nitrite: NEGATIVE
Protein, ur: NEGATIVE mg/dL
Specific Gravity, Urine: 1.02 (ref 1.005–1.030)
pH: 5 (ref 5.0–8.0)

## 2019-01-15 LAB — CBC WITH DIFFERENTIAL/PLATELET
Abs Immature Granulocytes: 0.03 10*3/uL (ref 0.00–0.07)
Basophils Absolute: 0 10*3/uL (ref 0.0–0.1)
Basophils Relative: 0 %
Eosinophils Absolute: 0 10*3/uL (ref 0.0–0.5)
Eosinophils Relative: 0 %
HCT: 41.5 % (ref 36.0–46.0)
Hemoglobin: 13.4 g/dL (ref 12.0–15.0)
Immature Granulocytes: 0 %
Lymphocytes Relative: 13 %
Lymphs Abs: 1.4 10*3/uL (ref 0.7–4.0)
MCH: 25.3 pg — ABNORMAL LOW (ref 26.0–34.0)
MCHC: 32.3 g/dL (ref 30.0–36.0)
MCV: 78.3 fL — ABNORMAL LOW (ref 80.0–100.0)
Monocytes Absolute: 1.4 10*3/uL — ABNORMAL HIGH (ref 0.1–1.0)
Monocytes Relative: 13 %
Neutro Abs: 7.7 10*3/uL (ref 1.7–7.7)
Neutrophils Relative %: 74 %
Platelets: 289 10*3/uL (ref 150–400)
RBC: 5.3 MIL/uL — ABNORMAL HIGH (ref 3.87–5.11)
RDW: 16.3 % — ABNORMAL HIGH (ref 11.5–15.5)
WBC: 10.6 10*3/uL — ABNORMAL HIGH (ref 4.0–10.5)
nRBC: 0 % (ref 0.0–0.2)

## 2019-01-15 LAB — BASIC METABOLIC PANEL
Anion gap: 9 (ref 5–15)
BUN: 10 mg/dL (ref 6–20)
CO2: 21 mmol/L — ABNORMAL LOW (ref 22–32)
Calcium: 8.7 mg/dL — ABNORMAL LOW (ref 8.9–10.3)
Chloride: 105 mmol/L (ref 98–111)
Creatinine, Ser: 1.16 mg/dL — ABNORMAL HIGH (ref 0.44–1.00)
GFR calc Af Amer: >60 mL/min (ref >60)
GFR calc non Af Amer: >60 mL/min (ref >60)
Glucose, Bld: 91 mg/dL (ref 70–99)
Potassium: 3.8 mmol/L (ref 3.5–5.1)
Sodium: 135 mmol/L (ref 135–145)

## 2019-01-15 LAB — PROCALCITONIN: Procalcitonin: <0.1 ng/mL

## 2019-01-15 LAB — POCT PREGNANCY, URINE: Preg Test, Ur: NEGATIVE

## 2019-01-15 LAB — GROUP A STREP BY PCR: Group A Strep by PCR: NOT DETECTED

## 2019-01-15 MED ORDER — FAMOTIDINE 20 MG PO TABS: 20.0000 mg | ORAL_TABLET | Freq: Two times a day (BID) | ORAL | 0 refills | Status: DC

## 2019-01-15 MED ORDER — DEXAMETHASONE SODIUM PHOSPHATE 10 MG/ML IJ SOLN
10.0000 mg | Freq: Once | INTRAMUSCULAR | Status: AC
  Administered 2019-01-15: 10 mg via INTRAVENOUS
  Filled 2019-01-15: qty 1

## 2019-01-15 MED ORDER — ACETAMINOPHEN 160 MG/5ML PO SUSP: 650.0000 mg | Freq: Once | ORAL | Status: DC

## 2019-01-15 MED ORDER — SODIUM CHLORIDE 0.9 % IV SOLN
3.0000 g | Freq: Once | INTRAVENOUS | Status: AC
  Administered 2019-01-15: 3 g via INTRAVENOUS
  Filled 2019-01-15: qty 8

## 2019-01-15 MED ORDER — IOHEXOL 300 MG/ML  SOLN
75.0000 mL | Freq: Once | INTRAMUSCULAR | Status: AC | PRN
  Administered 2019-01-15: 75 mL via INTRAVENOUS

## 2019-01-15 MED ORDER — SODIUM CHLORIDE 0.9 % IV BOLUS
1000.0000 mL | Freq: Once | INTRAVENOUS | Status: AC
  Administered 2019-01-15: 1000 mL via INTRAVENOUS

## 2019-01-15 MED ORDER — KETOROLAC TROMETHAMINE 10 MG PO TABS: 10.0000 mg | ORAL_TABLET | Freq: Four times a day (QID) | ORAL | 0 refills | Status: DC | PRN

## 2019-01-15 MED ORDER — KETOROLAC TROMETHAMINE 30 MG/ML IJ SOLN
15.0000 mg | Freq: Once | INTRAMUSCULAR | Status: AC
  Administered 2019-01-15: 15 mg via INTRAVENOUS
  Filled 2019-01-15: qty 1

## 2019-01-15 MED ORDER — PREDNISONE 20 MG PO TABS: 40.0000 mg | ORAL_TABLET | Freq: Every day | ORAL | 0 refills | Status: DC

## 2019-01-15 MED ORDER — AMOXICILLIN 250 MG PO CHEW: 500.0000 mg | CHEWABLE_TABLET | Freq: Two times a day (BID) | ORAL | 0 refills | Status: AC

## 2019-01-15 NOTE — Discharge Instructions (Addendum)
Your CT scan shows that there is no abscess in your tonsils today, but they are inflamed and swollen.  Take Toradol and prednisone to control the inflammation and pain.  Take famotidine to protect your stomach from the side effects of Toradol and prednisone.  Take amoxicillin to treat possible bacterial infection of the tonsils.  We sent a COVID test which should be resulted tomorrow.  We will contact you if it is positive.  In the meantime you should isolate yourself at home and avoid contact with other people.

## 2019-01-15 NOTE — ED Triage Notes (Addendum)
Pt c/o swollen tonsils since last night, states worse on the right side, painful to swallowing. Pt denies any COVID contacts.   Right tonsil more swolllen than the left side

## 2019-01-15 NOTE — ED Provider Notes (Signed)
[facility] Emergency Department Provider Note  ____________________________________________  Time seen: Approximately 8:05 AM  I have reviewed the triage vital signs and the nursing notes.   HISTORY  Chief Complaint Sore Throat    HPI Candice Lewis is a 23 y.o. female with a past medical history of asthma and seizures, not currently on any medications, who complains of sore throat, fast heart rate gradual onset and worsening since yesterday.  No known sick contacts.  Denies difficulty breathing.  He does feel difficult to swallow solids.  No cough.  Symptoms are constant, moderate intensity, no aggravating or alleviating factors.      Past Medical History:  Diagnosis Date  . Asthma    WELL CONTROLLED-NO INHALERS CURRENTLY   . Gallstones   . GERD (gastroesophageal reflux disease)    OCC-NO MEDS  . Migraine   . Migraine   . Seizures (HCC)    POSSIBLE AT AGE 3 OR 14 X 1-PT STATES UNSURE IF IT WAS A DEFINITE SEIZURE     Patient Active Problem List   Diagnosis Date Noted  . History of migraine headaches 07/15/2018  . Unintentional poisoning by aspirin 07/14/2018  . Cannabis use disorder, moderate, dependence (HCC) 07/14/2018  . Biliary colic   . Abdominal pain, right upper quadrant 03/09/2017  . Positive GBS test 01/09/2017  . Pregnancy 12/17/2016  . Obesity (BMI 30.0-34.9) 06/23/2016  . Spontaneous vaginal delivery 06/23/2016  . Seizures (HCC) 06/20/2016  . Asthma 01/10/2013  . Migraine with aura 01/10/2013     Past Surgical History:  Procedure Laterality Date  . CHOLECYSTECTOMY N/A 03/24/2017   Procedure: LAPAROSCOPIC CHOLECYSTECTOMY;  Surgeon: Ricarda FrameWoodham, Charles, MD;  Location: [facility] ORS;  Service: General;  Laterality: N/A;  . WISDOM TOOTH EXTRACTION       Prior to Admission medications   Medication Sig Start Date End Date Taking? Authorizing Provider  amoxicillin (AMOXIL) 250 MG chewable tablet Chew 2 tablets (500 mg total) by mouth 2  (two) times daily for 7 days. 01/15/19 01/22/19  Sharman CheekStafford, Gwynn Crossley, MD  famotidine (PEPCID) 20 MG tablet Take 1 tablet (20 mg total) by mouth 2 (two) times daily for 10 days. 01/15/19 01/25/19  Sharman CheekStafford, Layson Bertsch, MD  ketorolac (TORADOL) 10 MG tablet Take 1 tablet (10 mg total) by mouth every 6 (six) hours as needed for moderate pain. 01/15/19   Sharman CheekStafford, Antonis Lor, MD  predniSONE (DELTASONE) 20 MG tablet Take 2 tablets (40 mg total) by mouth daily. 01/15/19   Sharman CheekStafford, Merriam Brandner, MD  SUMAtriptan (IMITREX) 50 MG tablet Take 1 tablet (50 mg total) by mouth every 2 (two) hours as needed for migraine. May repeat in 2 hours if headache persists or recurs. 07/15/18   Shari ProwsPucilowska, Jolanta B, MD     Allergies Ondansetron, Zofran [ondansetron hcl], and Latex   Family History  Problem Relation Age of Onset  . Gallstones Mother     Social History Social History   Tobacco Use  . Smoking status: Never Smoker  . Smokeless tobacco: Never Used  Substance Use Topics  . Alcohol use: No  . Drug use: No    Review of Systems  Constitutional:   Positive fever and chills.  ENT:   Positive sore throat. No rhinorrhea. Cardiovascular:   No chest pain or syncope. Respiratory:   No dyspnea or cough. Gastrointestinal:   Negative for abdominal pain, vomiting and diarrhea.  Musculoskeletal:   Negative for focal pain or swelling All other systems reviewed and are negative except as documented above  in ROS and HPI.  ____________________________________________   PHYSICAL EXAM:  VITAL SIGNS: ED Triage Vitals [01/15/19 0722]  Enc Vitals Group     BP 112/75     Pulse Rate (!) 123     Resp 18     Temp (!) 101.9 F (38.8 C)     Temp Source Oral     SpO2 98 %     Weight 195 lb (88.5 kg)     Height 5' (1.524 m)     Head Circumference      Peak Flow      Pain Score      Pain Loc      Pain Edu?      Excl. in GC?     Vital signs reviewed, nursing assessments reviewed.   Constitutional:   Alert and  oriented. Non-toxic appearance. Eyes:   Conjunctivae are normal. EOMI. PERRL. ENT      Head:   Normocephalic and atraumatic.      Nose:   No congestion/rhinnorhea.       Mouth/Throat:   MMM, pharyngeal erythema, enlarged tonsils with purulent exudates.  Right tonsil is asymmetrically enlarged with posterior expansion concerning for abscess, abutting the uvula      Neck:   No meningismus. Full ROM. Hematological/Lymphatic/Immunilogical:   No cervical lymphadenopathy.  Tenderness of the right submandibular space Cardiovascular:   Tachycardia heart rate 120. Symmetric bilateral radial and DP pulses.  No murmurs. Cap refill less than 2 seconds. Respiratory:   Normal respiratory effort without tachypnea/retractions. Breath sounds are clear and equal bilaterally. No wheezes/rales/rhonchi. Gastrointestinal:   Soft and nontender. Non distended. There is no CVA tenderness.  No rebound, rigidity, or guarding.  Musculoskeletal:   Normal range of motion in all extremities. No joint effusions.  No lower extremity tenderness.  No edema. Neurologic:   Normal speech and language.  Motor grossly intact. No acute focal neurologic deficits are appreciated.  Skin:    Skin is warm, dry and intact. No rash noted.  No petechiae, purpura, or bullae.  ____________________________________________    LABS (pertinent positives/negatives) (all labs ordered are listed, but only abnormal results are displayed) Labs Reviewed  BASIC METABOLIC PANEL - Abnormal; Notable for the following components:      Result Value   CO2 21 (*)    Creatinine, Ser 1.16 (*)    Calcium 8.7 (*)    All other components within normal limits  CBC WITH DIFFERENTIAL/PLATELET - Abnormal; Notable for the following components:   WBC 10.6 (*)    RBC 5.30 (*)    MCV 78.3 (*)    MCH 25.3 (*)    RDW 16.3 (*)    Monocytes Absolute 1.4 (*)    All other components within normal limits  URINALYSIS, COMPLETE (UACMP) WITH MICROSCOPIC - Abnormal;  Notable for the following components:   Color, Urine YELLOW (*)    APPearance CLEAR (*)    Ketones, ur 5 (*)    All other components within normal limits  GROUP A STREP BY PCR  NOVEL CORONAVIRUS, NAA (HOSPITAL ORDER, SEND-OUT TO REF LAB)  PROCALCITONIN  POC URINE PREG, ED  POCT PREGNANCY, URINE   ____________________________________________   EKG  Interpreted by me Sinus tachycardia rate 114, normal axis intervals QRS ST segments and T waves  ____________________________________________    RADIOLOGY  Ct Soft Tissue Neck W Contrast  Result Date: 01/15/2019 CLINICAL DATA:  Tonsil/adenoid disorder. Sore throat and stridor. Epiglottitis or tonsillitis suspected. Difficulty swallowing speak. Right-sided neck  pain. EXAM: CT NECK WITH CONTRAST TECHNIQUE: Multidetector CT imaging of the neck was performed using the standard protocol following the bolus administration of intravenous contrast. CONTRAST:  75mL OMNIPAQUE IOHEXOL 300 MG/ML  SOLN COMPARISON:  None. FINDINGS: Pharynx and larynx: There is asymmetric enlargement of the right palatine tonsil. Both tonsils are enlarged. There is moderate prominence of adenoid tissue is well. No discrete abscess or fluid collection is present. There is mass effect on the oropharyngeal airway without significant stenosis or occlusion. The epiglottis is unremarkable. Hypopharynx is within normal limits. Vocal cords are midline and symmetric. Trachea is normal. Salivary glands: Submandibular and parotid glands are within normal limits bilaterally. Ducts are unremarkable. Thyroid: Normal Lymph nodes: Enlarged bilateral submandibular and level 2 lymph nodes are likely reactive. Enlarged nodes are ovoid. No necrotic or hyperdense nodes are present. Vascular: No significant vascular calcifications or malformations are present. There is no significant stenosis. Limited intracranial: Within normal limits Visualized orbits: Globes and orbits are normal. Mastoids and  visualized paranasal sinuses: The paranasal sinuses and mastoid air cells are clear. Skeleton: There is straightening and reversal of the normal cervical lordosis. Vertebral body heights are maintained. No focal lytic or blastic lesions are present. Upper chest: Lung apices are clear. Thoracic inlet is within normal limits. IMPRESSION: 1. Enlarged palatine tonsils, right greater than left compatible with acute pharyngitis/tonsillitis. 2. No focal fluid collection or evidence for developing abscess. 3. Prominent adenoid tissue is also likely reactive. 4. Enlarged bilateral submandibular and level 2 lymph nodes appear reactive. Electronically Signed   By: Marin Robertshristopher  Mattern M.D.   On: 01/15/2019 11:26    ____________________________________________   PROCEDURES Procedures  ____________________________________________  DIFFERENTIAL DIAGNOSIS   Tonsillitis, peritonsillar abscess, group A strep infection, COVID-19, unspecified viral illness  CLINICAL IMPRESSION / ASSESSMENT AND PLAN / ED COURSE  Medications ordered in the ED: Medications  ketorolac (TORADOL) 30 MG/ML injection 15 mg (15 mg Intravenous Given 01/15/19 0827)  sodium chloride 0.9 % bolus 1,000 mL (0 mLs Intravenous Stopped 01/15/19 1244)  dexamethasone (DECADRON) injection 10 mg (10 mg Intravenous Given 01/15/19 0825)  Ampicillin-Sulbactam (UNASYN) 3 g in sodium chloride 0.9 % 100 mL IVPB (0 g Intravenous Stopped 01/15/19 0948)  iohexol (OMNIPAQUE) 300 MG/ML solution 75 mL (75 mLs Intravenous Contrast Given 01/15/19 1050)    Pertinent labs & imaging results that were available during my care of the patient were reviewed by me and considered in my medical decision making (see chart for details).  Candice Lewis was evaluated in Emergency Department on 01/15/2019 for the symptoms described in the history of present illness. She was evaluated in the context of the global COVID-19 pandemic, which necessitated consideration that the patient  might be at risk for infection with the SARS-CoV-2 virus that causes COVID-19. Institutional protocols and algorithms that pertain to the evaluation of patients at risk for COVID-19 are in a state of rapid change based on information released by regulatory bodies including the CDC and federal and state organizations. These policies and algorithms were followed during the patient's care in the ED.   Patient presents with fever tachycardia, apparent peritonsillar abscess.  I will get a CT scan of the neck to further evaluate for possible need for surgical intervention.  I will give a dose of IV Unasyn and Decadron and Toradol 50 mg IV for antibiotic therapy, anti-inflammatory effect, and pain control.  Check a strep swab and a COVID swab.  Due to limitations in testing supplies, COVID swab will need  to be done through a [facility] send out which is expected to have about an 18-hour turnaround time.  ----------------------------------------- 1:09 PM on 01/15/2019 -----------------------------------------  CT consistent with tonsillitis without abscess.  Strep test negative, procalcitonin negative, possibly due to COVID.  Vital signs are improved with IV fluids and pain control.  We will continue her on Toradol prednisone, famotidine for stomach prophylaxis, amoxicillin, follow-up with primary care.  Counseled on pending COVID result      ____________________________________________   FINAL CLINICAL IMPRESSION(S) / ED DIAGNOSES    Final diagnoses:  Tonsillitis     ED Discharge Orders         Ordered    ketorolac (TORADOL) 10 MG tablet  Every 6 hours PRN     01/15/19 1307    predniSONE (DELTASONE) 20 MG tablet  Daily     01/15/19 1307    famotidine (PEPCID) 20 MG tablet  2 times daily     01/15/19 1307    amoxicillin (AMOXIL) 250 MG chewable tablet  2 times daily     01/15/19 1307          Portions of this note were generated with dragon dictation software. Dictation errors may  occur despite best attempts at proofreading.   Carrie Mew, MD 01/15/19 1310

## 2019-01-15 NOTE — ED Notes (Signed)
Pt states that her throat began to swell yesterday, and her tonsils became enlarged. Pt states that it is difficult and painful to swallow and speak. Pt states that the right side of her neck and ear are tender to touch.

## 2019-01-15 NOTE — ED Notes (Signed)
Pt ambulatory to toilet to obtain urine sample.  

## 2019-01-15 NOTE — ED Notes (Signed)
Pt ambulatory to toilet with steady gait noted.  

## 2019-01-16 LAB — NOVEL CORONAVIRUS, NAA (HOSP ORDER, SEND-OUT TO REF LAB; TAT 18-24 HRS): SARS-CoV-2, NAA: DETECTED — AB

## 2019-01-17 ENCOUNTER — Telehealth: Payer: Self-pay | Admitting: Emergency Medicine

## 2019-01-17 NOTE — Telephone Encounter (Signed)
Called to make sure patient is aware of Covid reuslt positive and cdc guidelines.  No answer and no voicemail.

## 2019-07-20 ENCOUNTER — Emergency Department
Admission: EM | Admit: 2019-07-20 | Discharge: 2019-07-20 | Disposition: A | Discharge status: Home / Self Care | Payer: Self-pay | Attending: Emergency Medicine | Admitting: Emergency Medicine

## 2019-07-20 ENCOUNTER — Encounter: Payer: Self-pay | Admitting: Emergency Medicine

## 2019-07-20 ENCOUNTER — Other Ambulatory Visit: Payer: Self-pay

## 2019-07-20 ENCOUNTER — Emergency Department: Payer: Self-pay

## 2019-07-20 DIAGNOSIS — Z79899 Other long term (current) drug therapy: Secondary | ICD-10-CM | POA: Insufficient documentation

## 2019-07-20 DIAGNOSIS — R059 Cough, unspecified: Secondary | ICD-10-CM

## 2019-07-20 DIAGNOSIS — J45909 Unspecified asthma, uncomplicated: Secondary | ICD-10-CM | POA: Insufficient documentation

## 2019-07-20 DIAGNOSIS — Z91040 Latex allergy status: Secondary | ICD-10-CM | POA: Insufficient documentation

## 2019-07-20 DIAGNOSIS — R05 Cough: Secondary | ICD-10-CM | POA: Insufficient documentation

## 2019-07-20 MED ORDER — ALBUTEROL SULFATE HFA 108 (90 BASE) MCG/ACT IN AERS: 2.0000 | INHALATION_SPRAY | Freq: Four times a day (QID) | RESPIRATORY_TRACT | 0 refills | Status: DC | PRN

## 2019-07-20 MED ORDER — BENZONATATE 100 MG PO CAPS: 100.0000 mg | ORAL_CAPSULE | Freq: Three times a day (TID) | ORAL | 0 refills | Status: AC | PRN

## 2019-07-20 NOTE — ED Provider Notes (Signed)
Emergency Department Provider Note  ____________________________________________  Time seen: Approximately 11:16 PM  I have reviewed the triage vital signs and the nursing notes.   HISTORY  Chief Complaint Cough   Historian Patient     HPI Candice Lewis is a 24 y.o. female presents to the emergency department with nonproductive cough for the past 2 to 3 days.  Patient states that she has felt occasional shortness of breath.  Patient recovered from COVID-19 infection 3 to 4 months ago.  She denies chest pain or chest tightness.  She states that she has been wheezing. No emesis or diarrhea.  No other alleviating measures have been attempted.   Past Medical History:  Diagnosis Date  . Asthma    WELL CONTROLLED-NO INHALERS CURRENTLY   . Gallstones   . GERD (gastroesophageal reflux disease)    OCC-NO MEDS  . Migraine   . Migraine   . Seizures ([place])    POSSIBLE AT AGE 30 OR 14 X 1-PT STATES UNSURE IF IT WAS A DEFINITE SEIZURE     Immunizations up to date:  Yes.     Past Medical History:  Diagnosis Date  . Asthma    WELL CONTROLLED-NO INHALERS CURRENTLY   . Gallstones   . GERD (gastroesophageal reflux disease)    OCC-NO MEDS  . Migraine   . Migraine   . Seizures ([place])    POSSIBLE AT AGE 30 OR 14 X 1-PT STATES UNSURE IF IT WAS A DEFINITE SEIZURE    Patient Active Problem List   Diagnosis Date Noted  . History of migraine headaches 07/15/2018  . Unintentional poisoning by aspirin 07/14/2018  . Cannabis use disorder, moderate, dependence ([place]) 07/14/2018  . Biliary colic   . Abdominal pain, right upper quadrant 03/09/2017  . Positive GBS test 01/09/2017  . Pregnancy 12/17/2016  . Obesity (BMI 30.0-34.9) 06/23/2016  . Spontaneous vaginal delivery 06/23/2016  . Seizures ([place]) 06/20/2016  . Asthma 01/10/2013  . Migraine with aura 01/10/2013    Past Surgical History:  Procedure Laterality Date  . CHOLECYSTECTOMY N/A 03/24/2017   Procedure: LAPAROSCOPIC  CHOLECYSTECTOMY;  Surgeon: Clayburn Pert, MD;  Location: [facility] ORS;  Service: General;  Laterality: N/A;  . WISDOM TOOTH EXTRACTION      Prior to Admission medications   Medication Sig Start Date End Date Taking? Authorizing Provider  albuterol (VENTOLIN HFA) 108 (90 Base) MCG/ACT inhaler Inhale 2 puffs into the lungs every 6 (six) hours as needed for wheezing or shortness of breath. 07/20/19   Lannie Fields, PA-C  benzonatate (TESSALON PERLES) 100 MG capsule Take 1 capsule (100 mg total) by mouth 3 (three) times daily as needed for up to 7 days for cough. 07/20/19 07/27/19  Lannie Fields, PA-C  famotidine (PEPCID) 20 MG tablet Take 1 tablet (20 mg total) by mouth 2 (two) times daily for 10 days. 01/15/19 01/25/19  Carrie Mew, MD  ketorolac (TORADOL) 10 MG tablet Take 1 tablet (10 mg total) by mouth every 6 (six) hours as needed for moderate pain. 01/15/19   Carrie Mew, MD  predniSONE (DELTASONE) 20 MG tablet Take 2 tablets (40 mg total) by mouth daily. 01/15/19   Carrie Mew, MD  SUMAtriptan (IMITREX) 50 MG tablet Take 1 tablet (50 mg total) by mouth every 2 (two) hours as needed for migraine. May repeat in 2 hours if headache persists or recurs. 07/15/18   Clovis Fredrickson, MD    Allergies Ondansetron, Zofran Alvis Lemmings hcl], and Latex  Family History  Problem Relation Age of Onset  . Gallstones Mother     Social History Social History   Tobacco Use  . Smoking status: Never Smoker  . Smokeless tobacco: Never Used  Substance Use Topics  . Alcohol use: No    Comment: socially  . Drug use: No     Review of Systems  Constitutional: No fever/chills Eyes:  No discharge ENT: No upper respiratory complaints. Respiratory: Patient has cough. No SOB/ use of accessory muscles to breath  Gastrointestinal:   No nausea, no vomiting.  No diarrhea.  No constipation. Musculoskeletal: Negative for musculoskeletal pain. Skin: Negative for rash, abrasions, lacerations,  ecchymosis.    ____________________________________________   PHYSICAL EXAM:  VITAL SIGNS: ED Triage Vitals  Enc Vitals Group     BP 07/20/19 2239 (!) 115/52     Pulse Rate 07/20/19 2239 83     Resp 07/20/19 2239 14     Temp 07/20/19 2239 97.9 F (36.6 C)     Temp Source 07/20/19 2239 Oral     SpO2 07/20/19 2239 98 %     Weight 07/20/19 2240 207 lb (93.9 kg)     Height 07/20/19 2240 5\' 1"  (1.549 m)     Head Circumference --      Peak Flow --      Pain Score 07/20/19 2240 0     Pain Loc --      Pain Edu? --      Excl. in GC? --      Constitutional: Alert and oriented. Well appearing and in no acute distress. Eyes: Conjunctivae are normal. PERRL. EOMI. Head: Atraumatic. ENT:      Nose: No congestion/rhinnorhea.      Mouth/Throat: Mucous membranes are moist.  Neck: No stridor.  No cervical spine tenderness to palpation. Cardiovascular: Normal rate, regular rhythm. Normal S1 and S2.  Good peripheral circulation. Respiratory: Normal respiratory effort without tachypnea or retractions. Lungs CTAB. Good air entry to the bases with no decreased or absent breath sounds Gastrointestinal: Bowel sounds x 4 quadrants. Soft and nontender to palpation. No guarding or rigidity. No distention. Musculoskeletal: Full range of motion to all extremities. No obvious deformities noted Neurologic:  Normal for age. No gross focal neurologic deficits are appreciated.  Skin:  Skin is warm, dry and intact. No rash noted. Psychiatric: Mood and affect are normal for age. Speech and behavior are normal.   ____________________________________________   LABS (all labs ordered are listed, but only abnormal results are displayed)  Labs Reviewed - No data to display ____________________________________________  EKG   ____________________________________________  RADIOLOGY 07/22/19, personally viewed and evaluated these images (plain radiographs) as part of my medical decision making,  as well as reviewing the written report by the radiologist.    DG Chest 1 View  Result Date: 07/20/2019 CLINICAL DATA:  Cough EXAM: CHEST  1 VIEW COMPARISON:  04/04/2018 FINDINGS: The heart size and mediastinal contours are within normal limits. Both lungs are clear. The visualized skeletal structures are unremarkable. IMPRESSION: No active disease. Electronically Signed   By: 13/02/2018 M.D.   On: 07/20/2019 23:27    ____________________________________________    PROCEDURES  Procedure(s) performed:     Procedures     Medications - No data to display   ____________________________________________   INITIAL IMPRESSION / ASSESSMENT AND PLAN / ED COURSE  Pertinent labs & imaging results that were available during my care of the patient were reviewed by me and considered in my medical  decision making (see chart for details).      Assessment and Plan:  Cough  24 year old female presents to the emergency department with nonproductive cough for the past 2 to 3 days.  Vital signs are reassuring at triage.  On physical exam, patient had no increased work of breathing.  No adventitious lung sounds were auscultated.  Differential diagnosis included post viral pneumonia, unspecified viral URI, bronchitis...  Chest x-ray shows no consolidations, opacities or infiltrates that would suggest community-acquired pneumonia.  Patient was discharged with Tessalon Perles and albuterol inhaler.  Return precautions were given to return with new or worsening symptoms.  All patient questions were answered. ____________________________________________  FINAL CLINICAL IMPRESSION(S) / ED DIAGNOSES  Final diagnoses:  Cough      NEW MEDICATIONS STARTED DURING THIS VISIT:  ED Discharge Orders         Ordered    albuterol (VENTOLIN HFA) 108 (90 Base) MCG/ACT inhaler  Every 6 hours PRN     07/20/19 2320    benzonatate (TESSALON PERLES) 100 MG capsule  3 times daily PRN     07/20/19  2320              This chart was dictated using voice recognition software/Dragon. Despite best efforts to proofread, errors can occur which can change the meaning. Any change was purely unintentional.     Gasper Lloyd 07/20/19 2335    Shaune Pollack, MD 07/22/19 1620

## 2019-07-20 NOTE — ED Triage Notes (Signed)
Pt to ED from home c/o cough for a couple days, denies productive cough, denies fevers, specifically states discomfort in chest with coughs otherwise no other pain.  Hx of asthma, no inhalers at home to use.  Pt A&Ox4, chest rise even and unlabored, in NAD at this time.

## 2019-09-18 IMAGING — CR DG CHEST 2V
1 series · 2 of 2 positions shown · non-contrast
Comparison: November 27, 2015

CLINICAL DATA: Body aches for 3 days.  Fever and cough.

EXAM:
CHEST - 2 VIEW

[Series 1: dg chest 2 view · 0.14mm/px · 2 of 2 slices shown]
[im 1/2]
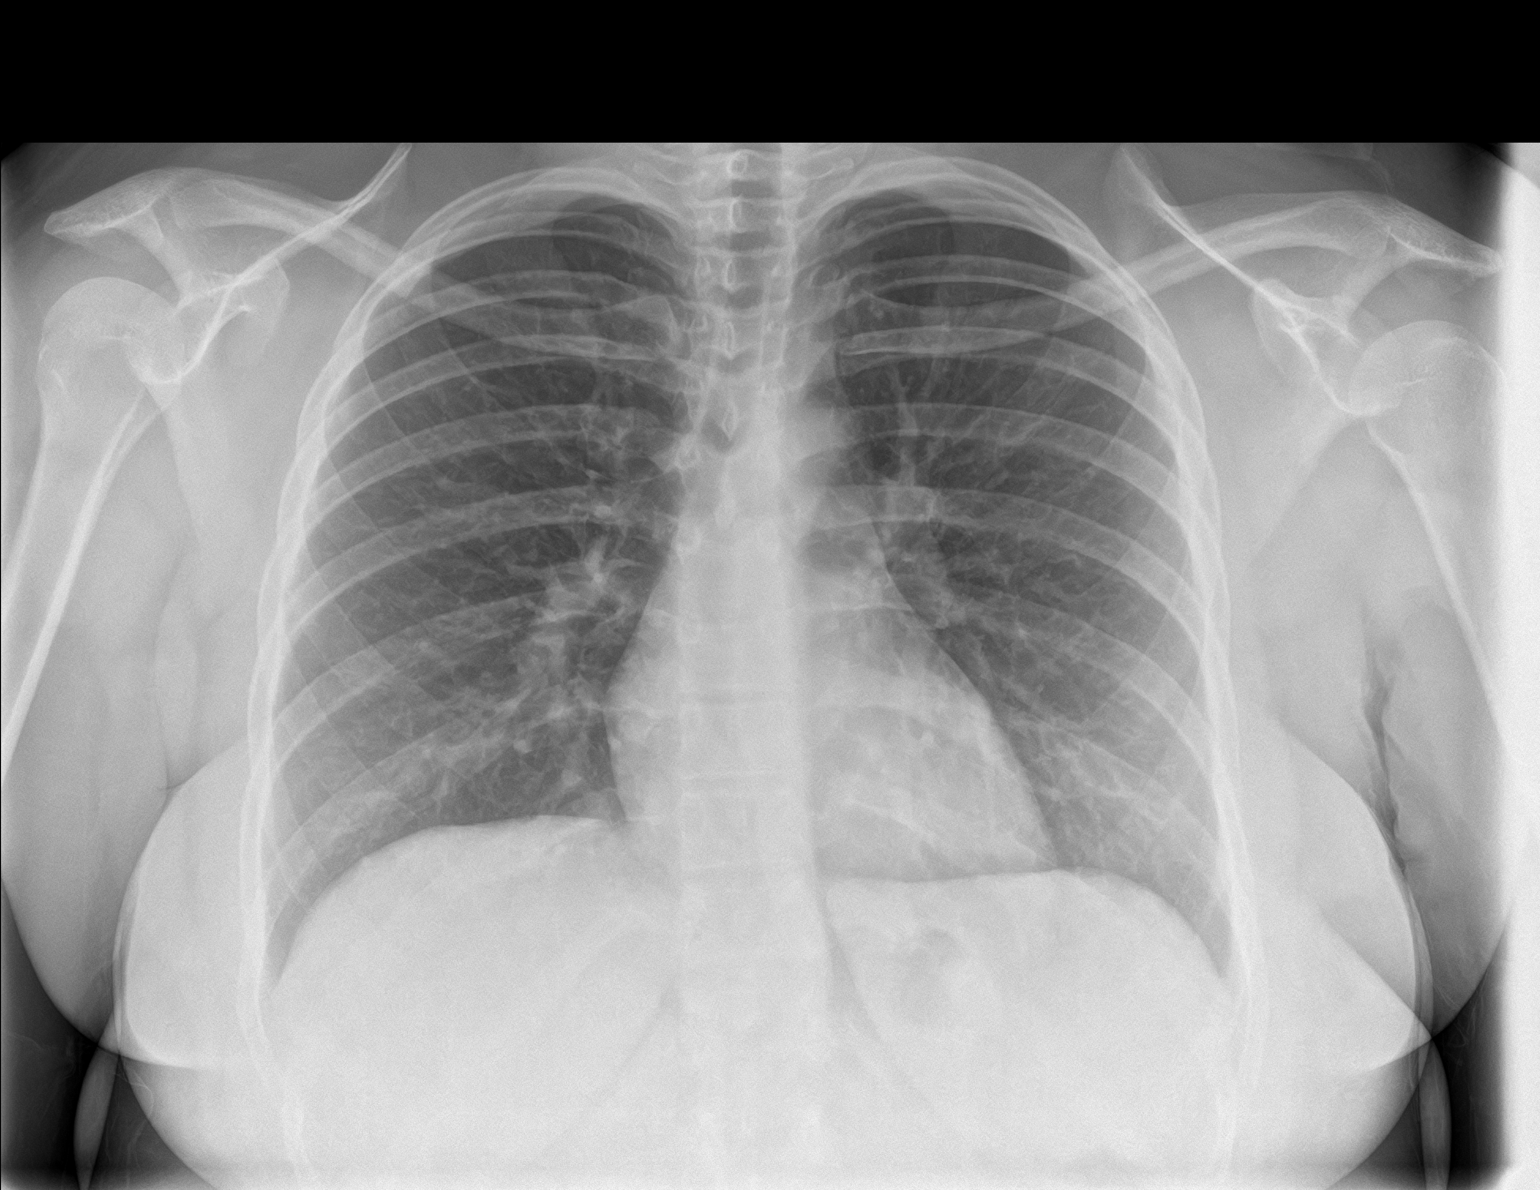
[im 2/2]
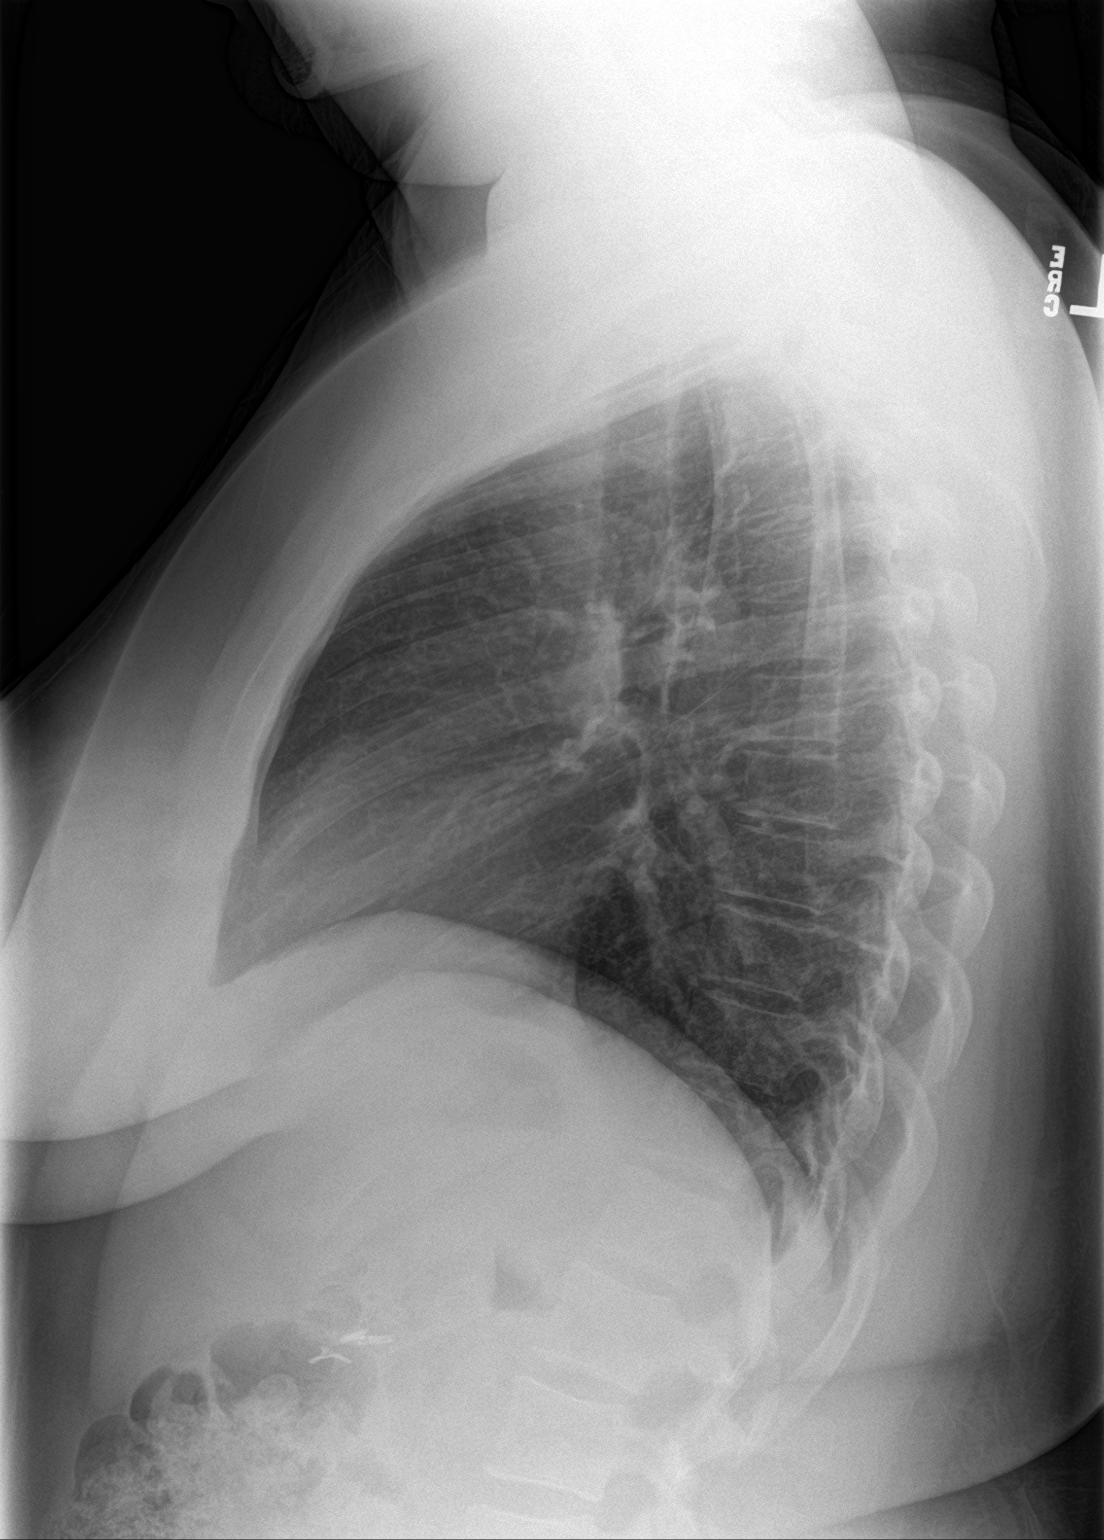

[2 of 2 positions shown; findings below may reference images not displayed]

FINDINGS: The heart size and mediastinal contours are within normal limits.
Both lungs are clear. The visualized skeletal structures are
unremarkable.
IMPRESSION: No active cardiopulmonary disease.

## 2019-09-22 ENCOUNTER — Encounter: Payer: Self-pay | Admitting: Emergency Medicine

## 2019-09-22 ENCOUNTER — Other Ambulatory Visit: Payer: Self-pay

## 2019-09-22 ENCOUNTER — Emergency Department
Admission: EM | Admit: 2019-09-22 | Discharge: 2019-09-22 | Disposition: A | Discharge status: Home / Self Care | Payer: Self-pay | Attending: Emergency Medicine | Admitting: Emergency Medicine

## 2019-09-22 DIAGNOSIS — Z9049 Acquired absence of other specified parts of digestive tract: Secondary | ICD-10-CM | POA: Insufficient documentation

## 2019-09-22 DIAGNOSIS — J45909 Unspecified asthma, uncomplicated: Secondary | ICD-10-CM | POA: Insufficient documentation

## 2019-09-22 DIAGNOSIS — J029 Acute pharyngitis, unspecified: Secondary | ICD-10-CM | POA: Insufficient documentation

## 2019-09-22 DIAGNOSIS — Z91040 Latex allergy status: Secondary | ICD-10-CM | POA: Insufficient documentation

## 2019-09-22 MED ORDER — AMOXICILLIN 250 MG/5ML PO SUSR: 500.0000 mg | Freq: Three times a day (TID) | ORAL | 0 refills | Status: DC

## 2019-09-22 NOTE — ED Triage Notes (Signed)
Patient ambulatory to triage with steady gait, without difficulty or distress noted, mask in place; pt reports sore throat x 3 days accomp by bilat earache; st hx tonsillitis

## 2019-09-22 NOTE — Discharge Instructions (Signed)
Take tylenol or ibuprofen for headache or fever.  Take the antibiotic until finished.  Follow up with primary care for symptoms that are not improving over the week.  Return to the ER for symptoms that change or worsen or for new concerns if unable to schedule an appointment.

## 2019-09-22 NOTE — ED Provider Notes (Signed)
[facility] Emergency Department Provider Note  ____________________________________________  Time seen: Approximately 7:54 AM  I have reviewed the triage vital signs and the nursing notes.   HISTORY  Chief Complaint Sore Throat    HPI Candice Lewis is a 24 y.o. female who presents to the emergency department for treatment and evaluation of sore throat that started 3 days ago. She has also had a headache intermittently and bilateral ear pain. History of tonsillitis.    Past Medical History:  Diagnosis Date  . Asthma    WELL CONTROLLED-NO INHALERS CURRENTLY   . Gallstones   . GERD (gastroesophageal reflux disease)    OCC-NO MEDS  . Migraine   . Migraine   . Seizures (HCC)    POSSIBLE AT AGE 25 OR 14 X 1-PT STATES UNSURE IF IT WAS A DEFINITE SEIZURE    Patient Active Problem List   Diagnosis Date Noted  . History of migraine headaches 07/15/2018  . Unintentional poisoning by aspirin 07/14/2018  . Cannabis use disorder, moderate, dependence (HCC) 07/14/2018  . Biliary colic   . Abdominal pain, right upper quadrant 03/09/2017  . Positive GBS test 01/09/2017  . Pregnancy 12/17/2016  . Obesity (BMI 30.0-34.9) 06/23/2016  . Spontaneous vaginal delivery 06/23/2016  . Seizures (HCC) 06/20/2016  . Asthma 01/10/2013  . Migraine with aura 01/10/2013    Past Surgical History:  Procedure Laterality Date  . CHOLECYSTECTOMY N/A 03/24/2017   Procedure: LAPAROSCOPIC CHOLECYSTECTOMY;  Surgeon: Ricarda Frame, MD;  Location: [facility] ORS;  Service: General;  Laterality: N/A;  . WISDOM TOOTH EXTRACTION      Prior to Admission medications   Medication Sig Start Date End Date Taking? Authorizing Provider  albuterol (VENTOLIN HFA) 108 (90 Base) MCG/ACT inhaler Inhale 2 puffs into the lungs every 6 (six) hours as needed for wheezing or shortness of breath. 07/20/19   Orvil Feil, PA-C  amoxicillin (AMOXIL) 250 MG/5ML suspension Take 10 mLs (500 mg total) by  mouth 3 (three) times daily. 09/22/19   Acen Craun, Rulon Eisenmenger B, FNP  famotidine (PEPCID) 20 MG tablet Take 1 tablet (20 mg total) by mouth 2 (two) times daily for 10 days. 01/15/19 01/25/19  Sharman Cheek, MD  SUMAtriptan (IMITREX) 50 MG tablet Take 1 tablet (50 mg total) by mouth every 2 (two) hours as needed for migraine. May repeat in 2 hours if headache persists or recurs. 07/15/18   Shari Prows, MD    Allergies Ondansetron, Zofran [ondansetron hcl], and Latex  Family History  Problem Relation Age of Onset  . Gallstones Mother     Social History Social History   Tobacco Use  . Smoking status: Never Smoker  . Smokeless tobacco: Never Used  Substance Use Topics  . Alcohol use: No    Comment: socially  . Drug use: No    Review of Systems Constitutional: Negative for fever. Eyes: No visual changes. ENT: Positive for sore throat; negative for difficulty swallowing. Respiratory: Denies shortness of breath. Gastrointestinal: Negative for abdominal pain.  No nausea, no vomiting.  No diarrhea.  Genitourinary: Negative for dysuria. Negative for decrease in need to void. Musculoskeletal: Negative for generalized body aches. Skin: Negative for rash. Neurological: Positive for headaches, negative for focal weakness or numbness.  ____________________________________________   PHYSICAL EXAM:  VITAL SIGNS: ED Triage Vitals  Enc Vitals Group     BP 09/22/19 0625 115/68     Pulse Rate 09/22/19 0625 70     Resp 09/22/19 0625 18  Temp 09/22/19 0625 97.7 F (36.5 C)     Temp Source 09/22/19 0625 Oral     SpO2 09/22/19 0625 98 %     Weight 09/22/19 0624 210 lb (95.3 kg)     Height 09/22/19 0624 5\' 1"  (1.549 m)     Head Circumference --      Peak Flow --      Pain Score 09/22/19 0624 8     Pain Loc --      Pain Edu? --      Excl. in [place]? --     Constitutional: Alert and oriented. Well appearing and in no acute distress. Eyes: Conjunctivae are normal.  Head:  Atraumatic. Nose: No congestion/rhinnorhea. Mouth/Throat: Mucous membranes are moist.  Oropharynx erythematous, tonsils 2+ with exudate. Uvula is midline. Ears: Right tympanic membrane appears injected. Left tympanic membrane appears injected. Neck: No stridor. Voice clear Lymphatic: Anterior cervical nodes palpable and tender > on right. Cardiovascular: Normal rate, regular rhythm. Good peripheral circulation. Respiratory: Normal respiratory effort. Lungs CTAB. Gastrointestinal: Soft and nontender. Musculoskeletal: FROM of neck, upper and lower extremities. Neurologic:  Normal speech and language. No gross focal neurologic deficits are appreciated. Skin:  Skin is warm, dry and intact. No rash noted Psychiatric: Mood and affect are normal. Speech and behavior are normal.  ____________________________________________   LABS (all labs ordered are listed, but only abnormal results are displayed)  Labs Reviewed - No data to display ____________________________________________  EKG  Not indiated ____________________________________________  RADIOLOGY  Not indicated. ____________________________________________   PROCEDURES  Procedure(s) performed: None  Critical Care performed: No ____________________________________________   INITIAL IMPRESSION / ASSESSMENT AND PLAN / ED COURSE  24 year old female presenting for treatment of sore throat. See HPI for further details.   Exam is consistent with tonsillitis. She will be treated with amoxicillin x 10 days. She is to take ibuprofen or tylenol for headache or fever. She is to return to the ER for symptoms that change or worsen or for new concerns if unable to schedule an appointment.  Pertinent labs & imaging results that were available during my care of the patient were reviewed by me and considered in my medical decision making (see chart for details). ____________________________________________  New Prescriptions    AMOXICILLIN (AMOXIL) 250 MG/5ML SUSPENSION    Take 10 mLs (500 mg total) by mouth 3 (three) times daily.    FINAL CLINICAL IMPRESSION(S) / ED DIAGNOSES  Final diagnoses:  Exudative pharyngitis    If controlled substance prescribed during this visit, 12 month history viewed on the [place] prior to issuing an initial prescription for Schedule II or III opiod.   Note:  This document was prepared using Dragon voice recognition software and may include unintentional dictation errors.   Victorino Dike, FNP 09/22/19 6712    Nance Pear, MD 09/22/19 [phone]

## 2019-09-22 NOTE — ED Notes (Signed)
See triage note  Presents with sore throat for the past 3 days   Now feels like her throat is swollen and she is having bilateral ear pain.  Denies any fever and is currently afebrile

## 2020-02-07 ENCOUNTER — Emergency Department
Admission: EM | Admit: 2020-02-07 | Discharge: 2020-02-07 | Disposition: A | Discharge status: Home / Self Care | Payer: Self-pay | Attending: Emergency Medicine | Admitting: Emergency Medicine

## 2020-02-07 ENCOUNTER — Other Ambulatory Visit: Payer: Self-pay

## 2020-02-07 ENCOUNTER — Encounter: Payer: Self-pay | Admitting: Emergency Medicine

## 2020-02-07 ENCOUNTER — Emergency Department: Payer: Self-pay

## 2020-02-07 DIAGNOSIS — R102 Pelvic and perineal pain unspecified side: Secondary | ICD-10-CM

## 2020-02-07 DIAGNOSIS — R1030 Lower abdominal pain, unspecified: Secondary | ICD-10-CM

## 2020-02-07 DIAGNOSIS — J45909 Unspecified asthma, uncomplicated: Secondary | ICD-10-CM | POA: Insufficient documentation

## 2020-02-07 DIAGNOSIS — Z91040 Latex allergy status: Secondary | ICD-10-CM | POA: Insufficient documentation

## 2020-02-07 DIAGNOSIS — R110 Nausea: Secondary | ICD-10-CM

## 2020-02-07 DIAGNOSIS — N83201 Unspecified ovarian cyst, right side: Secondary | ICD-10-CM

## 2020-02-07 DIAGNOSIS — N83202 Unspecified ovarian cyst, left side: Secondary | ICD-10-CM | POA: Insufficient documentation

## 2020-02-07 LAB — CBC WITH DIFFERENTIAL/PLATELET
Abs Immature Granulocytes: 0.01 10*3/uL (ref 0.00–0.07)
Basophils Absolute: 0 10*3/uL (ref 0.0–0.1)
Basophils Relative: 0 %
Eosinophils Absolute: 0.1 10*3/uL (ref 0.0–0.5)
Eosinophils Relative: 2 %
HCT: 39.4 % (ref 36.0–46.0)
Hemoglobin: 13 g/dL (ref 12.0–15.0)
Immature Granulocytes: 0 %
Lymphocytes Relative: 35 %
Lymphs Abs: 2.1 10*3/uL (ref 0.7–4.0)
MCH: 27.1 pg (ref 26.0–34.0)
MCHC: 33 g/dL (ref 30.0–36.0)
MCV: 82.1 fL (ref 80.0–100.0)
Monocytes Absolute: 0.6 10*3/uL (ref 0.1–1.0)
Monocytes Relative: 9 %
Neutro Abs: 3.2 10*3/uL (ref 1.7–7.7)
Neutrophils Relative %: 54 %
Platelets: 281 10*3/uL (ref 150–400)
RBC: 4.8 MIL/uL (ref 3.87–5.11)
RDW: 13.7 % (ref 11.5–15.5)
WBC: 5.9 10*3/uL (ref 4.0–10.5)
nRBC: 0 % (ref 0.0–0.2)

## 2020-02-07 LAB — URINALYSIS, COMPLETE (UACMP) WITH MICROSCOPIC
Bacteria, UA: NONE SEEN
Bilirubin Urine: NEGATIVE
Glucose, UA: NEGATIVE mg/dL
Hgb urine dipstick: NEGATIVE
Ketones, ur: NEGATIVE mg/dL
Leukocytes,Ua: NEGATIVE
Nitrite: NEGATIVE
Protein, ur: NEGATIVE mg/dL
Specific Gravity, Urine: 1.02 (ref 1.005–1.030)
pH: 5 (ref 5.0–8.0)

## 2020-02-07 LAB — COMPREHENSIVE METABOLIC PANEL
ALT: 12 U/L (ref 0–44)
AST: 24 U/L (ref 15–41)
Albumin: 3.9 g/dL (ref 3.5–5.0)
Alkaline Phosphatase: 85 U/L (ref 38–126)
Anion gap: 8 (ref 5–15)
BUN: 16 mg/dL (ref 6–20)
CO2: 23 mmol/L (ref 22–32)
Calcium: 8.6 mg/dL — ABNORMAL LOW (ref 8.9–10.3)
Chloride: 106 mmol/L (ref 98–111)
Creatinine, Ser: 1.12 mg/dL — ABNORMAL HIGH (ref 0.44–1.00)
GFR calc Af Amer: >60 mL/min (ref >60)
GFR calc non Af Amer: >60 mL/min (ref >60)
Glucose, Bld: 89 mg/dL (ref 70–99)
Potassium: 4.1 mmol/L (ref 3.5–5.1)
Sodium: 137 mmol/L (ref 135–145)
Total Bilirubin: 1 mg/dL (ref 0.3–1.2)
Total Protein: 7.4 g/dL (ref 6.5–8.1)

## 2020-02-07 LAB — PREGNANCY, URINE: Preg Test, Ur: NEGATIVE

## 2020-02-07 LAB — LIPASE, BLOOD: Lipase: 38 U/L (ref 11–51)

## 2020-02-07 MED ORDER — KETOROLAC TROMETHAMINE 30 MG/ML IJ SOLN
30.0000 mg | Freq: Once | INTRAMUSCULAR | Status: AC
  Administered 2020-02-07: 30 mg via INTRAMUSCULAR
  Filled 2020-02-07: qty 1

## 2020-02-07 MED ORDER — ONDANSETRON 4 MG PO TBDP
4.0000 mg | ORAL_TABLET | Freq: Once | ORAL | Status: DC
  Filled 2020-02-07: qty 1

## 2020-02-07 MED ORDER — ACETAMINOPHEN 500 MG PO TABS
1000.0000 mg | ORAL_TABLET | Freq: Once | ORAL | Status: AC
  Administered 2020-02-07: 1000 mg via ORAL
  Filled 2020-02-07: qty 2

## 2020-02-07 MED ORDER — PROMETHAZINE HCL 25 MG PO TABS
12.5000 mg | ORAL_TABLET | Freq: Once | ORAL | Status: AC
  Administered 2020-02-07: 12.5 mg via ORAL
  Filled 2020-02-07: qty 1

## 2020-02-07 MED ORDER — PROMETHAZINE HCL 12.5 MG PO TABS: 12.5000 mg | ORAL_TABLET | Freq: Four times a day (QID) | ORAL | 0 refills | Status: DC | PRN

## 2020-02-07 NOTE — ED Provider Notes (Signed)
[facility] Emergency Department Provider Note ____________________________________________   First MD Initiated Contact with Patient 02/07/20 [phone]     (approximate)  I have reviewed the triage vital signs and the nursing notes.  HISTORY  Chief Complaint No chief complaint on file.   HPI Candice Lewis is a 24 y.o. femalewho presents to the ED for evaluation of abdominal pain.   Chart review indicates no relevant medical history.  Patient is G1, P1 presents with 2 days of lower abdominal pain.  Patient reports intermittent and cramping lower abdominal pain, up to 8/10 intensity, localized throughout her lower abdomen.  She reports associated nausea without vomiting.  She reports associated vaginal spotting without gross bleeding or additional discharge.  Denies fevers, vomiting, diarrhea, postprandial abdominal pain, chest pain, dysuria, hematuria.  Pain not improved with home Tylenol, marginally improved with home ibuprofen.  No recent antibiotic use.  She reports a history of ovarian cysts around her 2018 pregnancy and this pain is reminiscent of that.  Reports her last menstrual period was about 1 month ago.  Does not have an OB/GYN as an outpatient.    Past Medical History:  Diagnosis Date  . Asthma    WELL CONTROLLED-NO INHALERS CURRENTLY   . Gallstones   . GERD (gastroesophageal reflux disease)    OCC-NO MEDS  . Migraine   . Migraine   . Seizures (HCC)    POSSIBLE AT AGE 71 OR 14 X 1-PT STATES UNSURE IF IT WAS A DEFINITE SEIZURE    Patient Active Problem List   Diagnosis Date Noted  . History of migraine headaches 07/15/2018  . Unintentional poisoning by aspirin 07/14/2018  . Cannabis use disorder, moderate, dependence (HCC) 07/14/2018  . Biliary colic   . Abdominal pain, right upper quadrant 03/09/2017  . Positive GBS test 01/09/2017  . Pregnancy 12/17/2016  . Obesity (BMI 30.0-34.9) 06/23/2016  . Spontaneous vaginal delivery 06/23/2016    . Seizures (HCC) 06/20/2016  . Asthma 01/10/2013  . Migraine with aura 01/10/2013    Past Surgical History:  Procedure Laterality Date  . CHOLECYSTECTOMY N/A 03/24/2017   Procedure: LAPAROSCOPIC CHOLECYSTECTOMY;  Surgeon: Ricarda Frame, MD;  Location: [facility] ORS;  Service: General;  Laterality: N/A;  . WISDOM TOOTH EXTRACTION      Prior to Admission medications   Medication Sig Start Date End Date Taking? Authorizing Provider  albuterol (VENTOLIN HFA) 108 (90 Base) MCG/ACT inhaler Inhale 2 puffs into the lungs every 6 (six) hours as needed for wheezing or shortness of breath. 07/20/19   Orvil Feil, PA-C  amoxicillin (AMOXIL) 250 MG/5ML suspension Take 10 mLs (500 mg total) by mouth 3 (three) times daily. 09/22/19   Triplett, Rulon Eisenmenger B, FNP  famotidine (PEPCID) 20 MG tablet Take 1 tablet (20 mg total) by mouth 2 (two) times daily for 10 days. 01/15/19 01/25/19  Sharman Cheek, MD  promethazine (PHENERGAN) 12.5 MG tablet Take 1 tablet (12.5 mg total) by mouth every 6 (six) hours as needed for nausea or vomiting. 02/07/20   Delton Prairie, MD  SUMAtriptan (IMITREX) 50 MG tablet Take 1 tablet (50 mg total) by mouth every 2 (two) hours as needed for migraine. May repeat in 2 hours if headache persists or recurs. 07/15/18   Shari Prows, MD    Allergies Ondansetron, Latex, and Zofran [ondansetron hcl]  Family History  Problem Relation Age of Onset  . Gallstones Mother     Social History Social History   Tobacco Use  . Smoking  status: Never Smoker  . Smokeless tobacco: Never Used  Vaping Use  . Vaping Use: Never used  Substance Use Topics  . Alcohol use: No    Comment: socially  . Drug use: No    Review of Systems  Constitutional: No fever/chills Eyes: No visual changes. ENT: No sore throat. Cardiovascular: Denies chest pain. Respiratory: Denies shortness of breath. Gastrointestinal: Positive for abdominal pain and nausea.  No vomiting.  No diarrhea.  No  constipation. Genitourinary: Negative for dysuria. Musculoskeletal: Negative for back pain. Skin: Negative for rash. Neurological: Negative for headaches, focal weakness or numbness.  ____________________________________________   PHYSICAL EXAM:  VITAL SIGNS: Vitals:   02/07/20 0355 02/07/20 0809  BP: 106/67 124/80  Pulse: 63 (!) 55  Resp: 18 16  Temp: 98.7 F (37.1 C) 97.9 F (36.6 C)  SpO2: 98% 99%      Constitutional: Alert and oriented. Well appearing and in no acute distress.  Obese. Eyes: Conjunctivae are normal. PERRL. EOMI. Head: Atraumatic. Nose: No congestion/rhinnorhea. Mouth/Throat: Mucous membranes are moist.  Oropharynx non-erythematous. Neck: No stridor. No cervical spine tenderness to palpation. Cardiovascular: Normal rate, regular rhythm. Grossly normal heart sounds.  Good peripheral circulation. Respiratory: Normal respiratory effort.  No retractions. Lungs CTAB. Gastrointestinal: Soft , nondistended. No abdominal bruits. No CVA tenderness. Diffuse and mild lower quadrant tenderness to palpation without peritoneal or further localizing features.  Upper abdomen is soft and benign. Musculoskeletal: No lower extremity tenderness nor edema.  No joint effusions. No signs of acute trauma. Neurologic:  Normal speech and language. No gross focal neurologic deficits are appreciated. No gait instability noted. Skin:  Skin is warm, dry and intact. No rash noted. Psychiatric: Mood and affect are normal. Speech and behavior are normal.  ____________________________________________   LABS (all labs ordered are listed, but only abnormal results are displayed)  Labs Reviewed  COMPREHENSIVE METABOLIC PANEL - Abnormal; Notable for the following components:      Result Value   Creatinine, Ser 1.12 (*)    Calcium 8.6 (*)    All other components within normal limits  URINALYSIS, COMPLETE (UACMP) WITH MICROSCOPIC - Abnormal; Notable for the following components:    Color, Urine YELLOW (*)    APPearance CLEAR (*)    All other components within normal limits  CBC WITH DIFFERENTIAL/PLATELET  LIPASE, BLOOD  PREGNANCY, URINE  POC URINE PREG, ED   ____________________________________________  12 Lead EKG   ____________________________________________  RADIOLOGY  ED MD interpretation:    Official radiology report(s): US PELVIC COMPLETE WITH TRANSVAGINAL  Result Date: 02/07/2020 CLINICAL DATA:  History of ovarian cysts and pelvic pain for 3 days. Also with history of irregular cycles EXAM: TRANSABDOMINAL AND TRANSVAGINAL ULTRASOUND OF PELVIS TECHNIQUE: Both transabdominal and transvaginal ultrasound examinations of the pelvis were performed. Transabdominal technique was performed for global imaging of the pelvis including uterus, ovaries, adnexal regions, and pelvic cul-de-sac. It was necessary to proceed with endovaginal exam following the transabdominal exam to visualize the uterus and adnexa as well as endometrium. COMPARISON:  Prior OB sonograms.  No standard pelvic sonography. FINDINGS: Measurements: 8.4 x 4.0 x 4.3 cm = volume: 75 mL. No fibroids or other mass visualized. Endometrium Thickness: 15 mm. Subtle cystic changes in the endometrium. No visible focal mass lesion. Right ovary Measurements: 3.4 x 1.5 x 1.8 cm = volume: 5.4 mL. Multiple small follicles. No dominant lesion or other abnormality. Left ovary Measurements: 3 3.0 x 2.0 x 2.1 cm = volume: 6.6 mL. Multiple small follicles. No adnexal  mass. No signs of free fluid. IMPRESSION: 1. Borderline endometrial thickening with some cystic changes, this can be seen in secretory phase endometrium though endometrial hyperplasia could also have this appearance, given history of irregular bleeding gyn consultation may be warranted for further assessment with 6-8 week follow-up for further assessment. If there is bleeding that remains unresponsive to hormonal or medical therapy, sonohysterogram should be  considered for focal lesion work-up. (Ref: Radiological Reasoning: Algorithmic Workup of Abnormal Vaginal Bleeding with Endovaginal Sonography and Sonohysterography. AJR 2008; 423:N36-14) 2. Cystic changes in the bilateral ovaries without dominant cyst or mass. Correlate with any clinical evidence of PCOS. Electronically Signed   By: Donzetta Kohut M.D.   On: 02/07/2020 10:01    ____________________________________________   PROCEDURES and INTERVENTIONS  Procedure(s) performed (including Critical Care):  Procedures  Medications  ondansetron (ZOFRAN-ODT) disintegrating tablet 4 mg (4 mg Oral Refused 02/07/20 0755)  ketorolac (TORADOL) 30 MG/ML injection 30 mg (30 mg Intramuscular Given 02/07/20 0756)  acetaminophen (TYLENOL) tablet 1,000 mg (1,000 mg Oral Given 02/07/20 0756)  promethazine (PHENERGAN) tablet 12.5 mg (12.5 mg Oral Given 02/07/20 0811)    ____________________________________________   MDM / ED COURSE  24 year old obese woman presents with 2 days of intermittent lower abdominal pain, most attributable to ovarian cysts and amenable to outpatient follow-up with OB/GYN follow-up.  Normal vital signs on room air.  Exam demonstrates an obese well-appearing patient with vague and diffuse lower quadrant abdominal tenderness without peritoneal or further localizing features.  Overall, she looks well and has a soft and benign abdomen.  Her blood work is without evidence of acute pathology.  She has no leukocytosis, vomiting or focal RLQ pain to suggest acute appendicitis.  Considering her history of ovarian cysts and similar-feeling symptoms today, TVUS obtained and demonstrates bilateral ovarian cysts at the likely source of her symptoms.  In discussing the case with radiologist, PCOS is certainly on the differential considering her overall phenotype.  Patient with improving symptoms after Toradol and Phenergan ministration.  Advised patient of NSAID usage as an outpatient and provided  prescription for Phenergan for symptom control until patient can follow-up with OB/GYN.  We discussed return precautions for the ED.  Patient medically stable for discharge home.  Clinical Course as of Feb 07 1019  Tue Feb 07, 2020  4315 Reassessed. Patient reports slightly improving symptoms. Upreg negative. Awaiting u/s   [DS]  0950 Spoke with rads. He sees multiple small follicles, thickened endometrium, consider PCOS, consider OBGYN outpatient    [DS]  [phone] Educated patient on her results.  Concern for possible PCOS.  Recommendations per OB/GYN follow-up.  Answered questions.  We discussed outpatient NSAID use.   [DS]    Clinical Course User Index [DS] Delton Prairie, MD     ____________________________________________   FINAL CLINICAL IMPRESSION(S) / ED DIAGNOSES  Final diagnoses:  Lower abdominal pain  Nausea without vomiting  Cysts of both ovaries     ED Discharge Orders         Ordered    promethazine (PHENERGAN) 12.5 MG tablet  Every 6 hours PRN        02/07/20 1028           Mallarie Voorhies   Note:  This document was prepared using Conservation officer, historic buildings and may include unintentional dictation errors.   Delton Prairie, MD 02/07/20 1030

## 2020-02-07 NOTE — Discharge Instructions (Addendum)
You were seen in the ED because of your lower abdominal pain and nausea. You have evidence of cysts on your ovaries that are likely causing her pain.  Please follow-up with the OB/GYN doctors within their clinic.  I have attached the phone number for Dr. Bonney Aid, who is on-call today.  Please call his clinic to set up an appointment to be seen within the next week or so.  Please take Tylenol and ibuprofen/Advil for your pain.  It is safe to take them together, or to alternate them every few hours.  Take up to 1000mg  of Tylenol at a time, up to 4 times per day.  Do not take more than 4000 mg of Tylenol in 24 hours.  For ibuprofen, take 400-600 mg, 4-5 times per day.  If you develop any worsening symptoms despite the above medications, please return to the ED

## 2020-02-07 NOTE — ED Triage Notes (Signed)
Patient ambulatory to triage with steady gait, without difficulty or distress noted; pt reports mid lower abd pain several days; st hx ovarian cysts

## 2020-03-05 ENCOUNTER — Emergency Department: Payer: Medicaid Other

## 2020-03-05 ENCOUNTER — Emergency Department
Admission: EM | Admit: 2020-03-05 | Discharge: 2020-03-05 | Disposition: A | Discharge status: Home / Self Care | Payer: Medicaid Other | Attending: Emergency Medicine | Admitting: Emergency Medicine

## 2020-03-05 ENCOUNTER — Other Ambulatory Visit: Payer: Self-pay

## 2020-03-05 DIAGNOSIS — Z91040 Latex allergy status: Secondary | ICD-10-CM | POA: Insufficient documentation

## 2020-03-05 DIAGNOSIS — J45909 Unspecified asthma, uncomplicated: Secondary | ICD-10-CM | POA: Insufficient documentation

## 2020-03-05 DIAGNOSIS — N939 Abnormal uterine and vaginal bleeding, unspecified: Secondary | ICD-10-CM | POA: Insufficient documentation

## 2020-03-05 DIAGNOSIS — R102 Pelvic and perineal pain: Secondary | ICD-10-CM | POA: Insufficient documentation

## 2020-03-05 LAB — COMPREHENSIVE METABOLIC PANEL
ALT: 14 U/L (ref 0–44)
AST: 18 U/L (ref 15–41)
Albumin: 4.1 g/dL (ref 3.5–5.0)
Alkaline Phosphatase: 88 U/L (ref 38–126)
Anion gap: 8 (ref 5–15)
BUN: 17 mg/dL (ref 6–20)
CO2: 27 mmol/L (ref 22–32)
Calcium: 9 mg/dL (ref 8.9–10.3)
Chloride: 104 mmol/L (ref 98–111)
Creatinine, Ser: 1.03 mg/dL — ABNORMAL HIGH (ref 0.44–1.00)
GFR, Estimated: >60 mL/min (ref >60)
Glucose, Bld: 65 mg/dL — ABNORMAL LOW (ref 70–99)
Potassium: 3.8 mmol/L (ref 3.5–5.1)
Sodium: 139 mmol/L (ref 135–145)
Total Bilirubin: 0.4 mg/dL (ref 0.3–1.2)
Total Protein: 7.7 g/dL (ref 6.5–8.1)

## 2020-03-05 LAB — CBC
HCT: 39.5 % (ref 36.0–46.0)
Hemoglobin: 12.8 g/dL (ref 12.0–15.0)
MCH: 26.4 pg (ref 26.0–34.0)
MCHC: 32.4 g/dL (ref 30.0–36.0)
MCV: 81.6 fL (ref 80.0–100.0)
Platelets: 286 10*3/uL (ref 150–400)
RBC: 4.84 MIL/uL (ref 3.87–5.11)
RDW: 13.4 % (ref 11.5–15.5)
WBC: 6.4 10*3/uL (ref 4.0–10.5)
nRBC: 0 % (ref 0.0–0.2)

## 2020-03-05 LAB — URINALYSIS, COMPLETE (UACMP) WITH MICROSCOPIC
Bacteria, UA: NONE SEEN
Bilirubin Urine: NEGATIVE
Glucose, UA: NEGATIVE mg/dL
Ketones, ur: NEGATIVE mg/dL
Leukocytes,Ua: NEGATIVE
Nitrite: NEGATIVE
Protein, ur: 30 mg/dL — AB
RBC / HPF: >50 RBC/hpf — ABNORMAL HIGH (ref 0–5)
Specific Gravity, Urine: 1.024 (ref 1.005–1.030)
pH: 5 (ref 5.0–8.0)

## 2020-03-05 LAB — POCT PREGNANCY, URINE: Preg Test, Ur: NEGATIVE

## 2020-03-05 MED ORDER — TRAMADOL HCL 50 MG PO TABS: 50.0000 mg | ORAL_TABLET | Freq: Four times a day (QID) | ORAL | 0 refills | Status: AC | PRN

## 2020-03-05 NOTE — ED Triage Notes (Signed)
Pt c/o vaginal bleeding for the past 3 weeks and is now having lower abd pain, states she has a hx of ovarian cyst and irregular periods. Pt is in nAD.

## 2020-03-05 NOTE — Discharge Instructions (Signed)
Take Tramadol for pain.  

## 2020-03-05 NOTE — ED Provider Notes (Signed)
Emergency Department Provider Note  ____________________________________________  Time seen: Approximately 3:33 PM  I have reviewed the triage vital signs and the nursing notes.   HISTORY  Chief Complaint Vaginal Bleeding   Historian Patient     HPI Candice Lewis is a 24 y.o. female presents to the emergency department with irregular vaginal bleeding for the past 3 weeks and worsening right-sided pelvic pain with nausea.  Patient denies fever and chills at home.  No emesis.  She states that she is uncertain when her normal menstrual cycle should start.  She is not currently taking any form of birth control.  No changes in vaginal discharge or dyspareunia.  No concerns for STDs.  No falls or abdominal traumas. No other alleviating measures have been attempted.    Past Medical History:  Diagnosis Date   Asthma    WELL CONTROLLED-NO INHALERS CURRENTLY    Gallstones    GERD (gastroesophageal reflux disease)    OCC-NO MEDS   Migraine    Migraine    Seizures (HCC)    POSSIBLE AT AGE 69 OR 14 X 1-PT STATES UNSURE IF IT WAS A DEFINITE SEIZURE     Immunizations up to date:  Yes.     Past Medical History:  Diagnosis Date   Asthma    WELL CONTROLLED-NO INHALERS CURRENTLY    Gallstones    GERD (gastroesophageal reflux disease)    OCC-NO MEDS   Migraine    Migraine    Seizures (HCC)    POSSIBLE AT AGE 69 OR 14 X 1-PT STATES UNSURE IF IT WAS A DEFINITE SEIZURE    Patient Active Problem List   Diagnosis Date Noted   History of migraine headaches 07/15/2018   Unintentional poisoning by aspirin 07/14/2018   Cannabis use disorder, moderate, dependence (HCC) 07/14/2018   Biliary colic    Abdominal pain, right upper quadrant 03/09/2017   Positive GBS test 01/09/2017   Pregnancy 12/17/2016   Obesity (BMI 30.0-34.9) 06/23/2016   Spontaneous vaginal delivery 06/23/2016   Seizures (HCC) 06/20/2016   Asthma 01/10/2013   Migraine with aura 01/10/2013     Past Surgical History:  Procedure Laterality Date   CHOLECYSTECTOMY N/A 03/24/2017   Procedure: LAPAROSCOPIC CHOLECYSTECTOMY;  Surgeon: Ricarda FrameWoodham, Charles, MD;  Location: [facility] ORS;  Service: General;  Laterality: N/A;   WISDOM TOOTH EXTRACTION      Prior to Admission medications   Medication Sig Start Date End Date Taking? Authorizing Provider  albuterol (VENTOLIN HFA) 108 (90 Base) MCG/ACT inhaler Inhale 2 puffs into the lungs every 6 (six) hours as needed for wheezing or shortness of breath. 07/20/19   Orvil FeilWoods, Lucelia Lacey M, PA-C  amoxicillin (AMOXIL) 250 MG/5ML suspension Take 10 mLs (500 mg total) by mouth 3 (three) times daily. 09/22/19   Triplett, Rulon Eisenmengerari B, FNP  famotidine (PEPCID) 20 MG tablet Take 1 tablet (20 mg total) by mouth 2 (two) times daily for 10 days. 01/15/19 01/25/19  Sharman CheekStafford, Phillip, MD  promethazine (PHENERGAN) 12.5 MG tablet Take 1 tablet (12.5 mg total) by mouth every 6 (six) hours as needed for nausea or vomiting. 02/07/20   Delton PrairieSmith, Dylan, MD  SUMAtriptan (IMITREX) 50 MG tablet Take 1 tablet (50 mg total) by mouth every 2 (two) hours as needed for migraine. May repeat in 2 hours if headache persists or recurs. 07/15/18   Pucilowska, Ellin GoodieJolanta B, MD  traMADol (ULTRAM) 50 MG tablet Take 1 tablet (50 mg total) by mouth every 6 (six) hours as needed for up to 3  days. 03/05/20 03/08/20  Orvil Feil, PA-C    Allergies Ondansetron, Latex, and Zofran [facility] hcl]  Family History  Problem Relation Age of Onset   Gallstones Mother     Social History Social History   Tobacco Use   Smoking status: Never Smoker   Smokeless tobacco: Never Used  Building services engineer Use: Never used  Substance Use Topics   Alcohol use: No    Comment: socially   Drug use: No     Review of Systems  Constitutional: No fever/chills Eyes:  No discharge ENT: No upper respiratory complaints. Respiratory: no cough. No SOB/ use of accessory muscles to breath Gastrointestinal:   No  nausea, no vomiting.  No diarrhea.  No constipation. Genitourinary: Patient has vaginal bleeding.  Musculoskeletal: Negative for musculoskeletal pain. Skin: Negative for rash, abrasions, lacerations, ecchymosis.    ____________________________________________   PHYSICAL EXAM:  VITAL SIGNS: ED Triage Vitals  Enc Vitals Group     BP 03/05/20 0910 114/60     Pulse Rate 03/05/20 0910 63     Resp 03/05/20 0910 16     Temp 03/05/20 0910 97.8 F (36.6 C)     Temp Source 03/05/20 0910 Oral     SpO2 03/05/20 0910 99 %     Weight 03/05/20 0910 200 lb (90.7 kg)     Height 03/05/20 0910 5\' 1"  (1.549 m)     Head Circumference --      Peak Flow --      Pain Score 03/05/20 0919 8     Pain Loc --      Pain Edu? --      Excl. in GC? --      Constitutional: Alert and oriented. Well appearing and in no acute distress. Eyes: Conjunctivae are normal. PERRL. EOMI. Head: Atraumatic. Cardiovascular: Normal rate, regular rhythm. Normal S1 and S2.  Good peripheral circulation. Respiratory: Normal respiratory effort without tachypnea or retractions. Lungs CTAB. Good air entry to the bases with no decreased or absent breath sounds Gastrointestinal: Bowel sounds x 4 quadrants. Soft and nontender to palpation. No guarding or rigidity. No distention. Musculoskeletal: Full range of motion to all extremities. No obvious deformities noted Neurologic:  Normal for age. No gross focal neurologic deficits are appreciated.  Skin:  Skin is warm, dry and intact. No rash noted. Psychiatric: Mood and affect are normal for age. Speech and behavior are normal.   ____________________________________________   LABS (all labs ordered are listed, but only abnormal results are displayed)  Labs Reviewed  COMPREHENSIVE METABOLIC PANEL - Abnormal; Notable for the following components:      Result Value   Glucose, Bld 65 (*)    Creatinine, Ser 1.03 (*)    All other components within normal limits  URINALYSIS,  COMPLETE (UACMP) WITH MICROSCOPIC - Abnormal; Notable for the following components:   Color, Urine YELLOW (*)    APPearance HAZY (*)    Hgb urine dipstick LARGE (*)    Protein, ur 30 (*)    RBC / HPF >50 (*)    All other components within normal limits  CBC  POC URINE PREG, ED  POCT PREGNANCY, URINE   ____________________________________________  EKG   ____________________________________________  RADIOLOGY 05/05/20, personally viewed and evaluated these images (plain radiographs) as part of my medical decision making, as well as reviewing the written report by the radiologist.    Geraldo Pitter PELVIC COMPLETE W TRANSVAGINAL AND TORSION R/O  Result Date: 03/05/2020 CLINICAL DATA:  Pelvic pain for 4 days, heavy vaginal bleeding for 3 days, negative UPT, G1P1 EXAM: TRANSABDOMINAL AND TRANSVAGINAL ULTRASOUND OF PELVIS DOPPLER ULTRASOUND OF OVARIES TECHNIQUE: Both transabdominal and transvaginal ultrasound examinations of the pelvis were performed. Transabdominal technique was performed for global imaging of the pelvis including uterus, ovaries, adnexal regions, and pelvic cul-de-sac. It was necessary to proceed with endovaginal exam following the transabdominal exam to visualize the the bilateral ovaries. Color and duplex Doppler ultrasound was utilized to evaluate blood flow to the ovaries. COMPARISON:  Pelvic ultrasound 02/07/2020 FINDINGS: Uterus Measurements: 8.2 x 4 2 x 5.3 cm = volume: 95 mL. Anteverted uterus. No concerning acute myometrial abnormality. No focal lesion or discernible fibroids. Normal vascular channels seen within the myometrium. Few scattered nabothian cysts towards the level of the cervix. Endometrium Thickness: 12 mm, non thickened in a reproductive age female. Subtle cystic changes are present of the endometrial lining, similar to comparison. Small amount of low-attenuation fluid towards the endocervical canal. Right ovary Measurements: 3.0 x 2.1 x 2.6 cm = volume: 6.1  mL. Numerous (>15) anechoic, peripherally marginated follicles appear positioned peripherally around a slightly echogenic central ovarian stroma. No concerning adnexal lesion. Left ovary Measurements: 2.7 x 2.1 x 2.7 cm = volume: 8 mL. Numerous (>15) anechoic, peripherally marginated follicles organized around a somewhat echogenic central ovarian stroma. No concerning adnexal lesion. Pulsed Doppler evaluation of both ovaries demonstrates normal low-resistance arterial and venous waveforms. Other findings Trace free fluid in the pelvis, nonspecific. IMPRESSION: Small volume of low-attenuation fluid in the endocervical canal, compatible with reported vaginal bleeding. Endometrial thickness is within normal limits albeit with some mild endometrial cystic change. Can be seen both with secretory phase versus cystic endometrial hyperplasia though the appearance is quite similar to prior. Given recurrent episodic irregular and heavy menses, finding warrants gynecologic consultation if not previously obtained. No other acute uterine abnormality or worrisome endometrial thickening. Few benign nabothian cysts. Numerous, anechoic and peripherally marginated follicles with central ovarian echogenicity. Appearance is suggestive of polycystic ovarian syndrome (PCOS) recommend correlation with clinical findings and serologies as warranted. Trace fluid in the pelvis is likely physiologic. Electronically Signed   By: Kreg Shropshire M.D.   On: 03/05/2020 16:57    ____________________________________________    PROCEDURES  Procedure(s) performed:     Procedures     Medications - No data to display   ____________________________________________   INITIAL IMPRESSION / ASSESSMENT AND PLAN / ED COURSE  Pertinent labs & imaging results that were available during my care of the patient were reviewed by me and considered in my medical decision making (see chart for details).    Assessment and Plan:  Pelvic pain:   24 year old female presents to the emergency department with pelvic pain and nausea.  Vital signs are reassuring at triage.  On physical exam, abdomen is soft and nontender without guarding.  Pelvic ultrasound was obtained with Doppler to rule out torsion given worsening pain and nausea.  No evidence of ovarian torsion on dedicated ultrasound.  CBC and CMP were reassuring.  Urine pregnancy testing was negative.  Urinalysis did not suggest cystitis.  Patient was given follow-up instructions to [facility]  Patient was discharged with tramadol for pain.  All patient questions were answered.   ____________________________________________  FINAL CLINICAL IMPRESSION(S) / ED DIAGNOSES  Final diagnoses:  Pelvic pain  Vaginal bleeding      NEW MEDICATIONS STARTED DURING THIS VISIT:  ED Discharge Orders  Ordered    traMADol (ULTRAM) 50 MG tablet  Every 6 hours PRN        03/05/20 1717              This chart was dictated using voice recognition software/Dragon. Despite best efforts to proofread, errors can occur which can change the meaning. Any change was purely unintentional.     Orvil Feil, PA-C 03/05/20 1819    Phineas Semen, MD 03/05/20 2013

## 2020-07-03 ENCOUNTER — Other Ambulatory Visit: Payer: Self-pay

## 2020-07-03 ENCOUNTER — Ambulatory Visit (LOCAL_COMMUNITY_HEALTH_CENTER): Payer: Medicaid Other

## 2020-07-03 VITALS — BP 110/74 | Wt 203.0 lb

## 2020-07-03 DIAGNOSIS — Z3202 Encounter for pregnancy test, result negative: Secondary | ICD-10-CM

## 2020-07-03 LAB — PREGNANCY, URINE: Preg Test, Ur: NEGATIVE

## 2020-07-04 NOTE — Progress Notes (Signed)
Patient reports home PTs have been positive but really faint.  Co to wait 1-2 weeks before retesting. Richmond Campbell, RN

## 2020-09-16 ENCOUNTER — Emergency Department: Payer: Medicaid Other

## 2020-09-16 ENCOUNTER — Other Ambulatory Visit: Payer: Self-pay

## 2020-09-16 DIAGNOSIS — J45909 Unspecified asthma, uncomplicated: Secondary | ICD-10-CM | POA: Insufficient documentation

## 2020-09-16 DIAGNOSIS — Z20822 Contact with and (suspected) exposure to covid-19: Secondary | ICD-10-CM | POA: Insufficient documentation

## 2020-09-16 DIAGNOSIS — Z28310 Unvaccinated for covid-19: Secondary | ICD-10-CM | POA: Insufficient documentation

## 2020-09-16 DIAGNOSIS — Z91040 Latex allergy status: Secondary | ICD-10-CM | POA: Insufficient documentation

## 2020-09-16 DIAGNOSIS — J069 Acute upper respiratory infection, unspecified: Secondary | ICD-10-CM | POA: Insufficient documentation

## 2020-09-16 NOTE — ED Triage Notes (Signed)
Pt with cough for two days. Pt states she does have some sputum production. Pt states she feels "rattling" in her chest. Pt states took at home covid test that was negative. Pt in no acute distress.

## 2020-09-17 ENCOUNTER — Emergency Department
Admission: EM | Admit: 2020-09-17 | Discharge: 2020-09-17 | Disposition: A | Discharge status: Home / Self Care | Payer: Medicaid Other | Attending: Emergency Medicine | Admitting: Emergency Medicine

## 2020-09-17 DIAGNOSIS — J069 Acute upper respiratory infection, unspecified: Secondary | ICD-10-CM

## 2020-09-17 LAB — RESP PANEL BY RT-PCR (FLU A&B, COVID) ARPGX2
Influenza A by PCR: NEGATIVE
Influenza B by PCR: NEGATIVE
SARS Coronavirus 2 by RT PCR: NEGATIVE

## 2020-09-17 MED ORDER — BENZONATATE 100 MG PO CAPS
100.0000 mg | ORAL_CAPSULE | Freq: Once | ORAL | Status: AC
  Administered 2020-09-17: 100 mg via ORAL
  Filled 2020-09-17: qty 1

## 2020-09-17 MED ORDER — IBUPROFEN 800 MG PO TABS
800.0000 mg | ORAL_TABLET | Freq: Once | ORAL | Status: AC
  Administered 2020-09-17: 800 mg via ORAL
  Filled 2020-09-17: qty 1

## 2020-09-17 MED ORDER — BENZONATATE 100 MG PO CAPS: 100.0000 mg | ORAL_CAPSULE | Freq: Three times a day (TID) | ORAL | 0 refills | Status: DC | PRN

## 2020-09-17 NOTE — Discharge Instructions (Signed)
You may alternate Tylenol 1000 mg every 6 hours as needed for pain, fever and Ibuprofen 800 mg every 8 hours as needed for pain, fever.  Please take Ibuprofen with food.  Do not take more than 4000 mg of Tylenol (acetaminophen) in a 24 hour period.   Steps to find a Primary Care Provider (PCP):  Call [phone] or [phone] to access "[place] [place] Find a Doctor Service."  2.  You may also go on the [facility] website at [email or web address]  

## 2020-09-17 NOTE — ED Notes (Signed)
Discussed dc instructions with patient.  No further needs assessed.

## 2020-09-17 NOTE — ED Provider Notes (Signed)
[facility] Emergency Department Provider Note  ____________________________________________   Event Date/Time   First MD Initiated Contact with Patient 09/17/20 0246     (approximate)  I have reviewed the triage vital signs and the nursing notes.   HISTORY  Chief Complaint Cough    HPI Candice Lewis is a 25 y.o. female with history of asthma, migraines who presents to the emergency department with complaints of 2 days of cough, chest soreness with coughing.  No documented fevers.  No sore throat, ear pain.  No vomiting or diarrhea.  Son with similar symptoms.  She has not been vaccinated against COVID-19 or influenza.        Past Medical History:  Diagnosis Date  . Asthma    WELL CONTROLLED-NO INHALERS CURRENTLY   . Gallstones   . GERD (gastroesophageal reflux disease)    OCC-NO MEDS  . Migraine   . Migraine   . Seizures (HCC)    POSSIBLE AT AGE 76 OR 14 X 1-PT STATES UNSURE IF IT WAS A DEFINITE SEIZURE    Patient Active Problem List   Diagnosis Date Noted  . History of migraine headaches 07/15/2018  . Unintentional poisoning by aspirin 07/14/2018  . Cannabis use disorder, moderate, dependence (HCC) 07/14/2018  . Biliary colic   . Abdominal pain, right upper quadrant 03/09/2017  . Positive GBS test 01/09/2017  . Pregnancy 12/17/2016  . Obesity (BMI 30.0-34.9) 06/23/2016  . Spontaneous vaginal delivery 06/23/2016  . Seizures (HCC) 06/20/2016  . Asthma 01/10/2013  . Migraine with aura 01/10/2013    Past Surgical History:  Procedure Laterality Date  . CHOLECYSTECTOMY N/A 03/24/2017   Procedure: LAPAROSCOPIC CHOLECYSTECTOMY;  Surgeon: Ricarda Frame, MD;  Location: [facility] ORS;  Service: General;  Laterality: N/A;  . WISDOM TOOTH EXTRACTION      Prior to Admission medications   Medication Sig Start Date End Date Taking? Authorizing Provider  benzonatate (TESSALON PERLES) 100 MG capsule Take 1 capsule (100 mg total) by mouth 3 (three)  times daily as needed for cough. 09/17/20 09/17/21 Yes Izaan Kingbird, Baxter Hire N, DO  albuterol (VENTOLIN HFA) 108 (90 Base) MCG/ACT inhaler Inhale 2 puffs into the lungs every 6 (six) hours as needed for wheezing or shortness of breath. 07/20/19   Orvil Feil, PA-C    Allergies Ondansetron, Latex, and Zofran [ondansetron hcl]  Family History  Problem Relation Age of Onset  . Gallstones Mother     Social History Social History   Tobacco Use  . Smoking status: Never Smoker  . Smokeless tobacco: Never Used  Vaping Use  . Vaping Use: Never used  Substance Use Topics  . Alcohol use: No    Comment: socially  . Drug use: No    Review of Systems Constitutional: No fever. Eyes: No visual changes. ENT: No sore throat. Cardiovascular: Denies chest pain. Respiratory: Denies shortness of breath. Gastrointestinal: No nausea, vomiting, diarrhea. Genitourinary: Negative for dysuria. Musculoskeletal: Negative for back pain. Skin: Negative for rash. Neurological: Negative for focal weakness or numbness.  ____________________________________________   PHYSICAL EXAM:  VITAL SIGNS: ED Triage Vitals [09/16/20 2303]  Enc Vitals Group     BP 122/78     Pulse Rate 100     Resp 20     Temp 99 F (37.2 C)     Temp Source Oral     SpO2 98 %     Weight 200 lb (90.7 kg)     Height 5' (1.524 m)  Head Circumference      Peak Flow      Pain Score 9     Pain Loc      Pain Edu?      Excl. in GC?    CONSTITUTIONAL: Alert and oriented and responds appropriately to questions. Well-appearing; well-nourished HEAD: Normocephalic EYES: Conjunctivae clear, pupils appear equal, EOM appear intact ENT: normal nose; moist mucous membranes; No pharyngeal erythema or petechiae, no tonsillar hypertrophy or exudate, no uvular deviation, no unilateral swelling, no trismus or drooling, no muffled voice, normal phonation, no stridor, no dental caries present, no drainable dental abscess noted, no Ludwig's  angina, tongue sits flat in the bottom of the mouth, no angioedema, no facial erythema or warmth, no facial swelling; no pain with movement of the neck, no cervical LAD.  TMs are clear bilaterally without erythema, purulence, bulging, perforation, effusion.  No cerumen impaction or sign of foreign body in the external auditory canal. No inflammation, erythema or drainage from the external auditory canal. No signs of mastoiditis. No pain with manipulation of the pinna bilaterally. NECK: Supple, normal ROM, no meningismus CARD: RRR; S1 and S2 appreciated; no murmurs, no clicks, no rubs, no gallops RESP: Normal chest excursion without splinting or tachypnea; breath sounds clear and equal bilaterally; no wheezes, no rhonchi, no rales, no hypoxia or respiratory distress, speaking full sentences ABD/GI: Normal bowel sounds; non-distended; soft, non-tender, no rebound, no guarding, no peritoneal signs, no hepatosplenomegaly BACK: The back appears normal EXT: Normal ROM in all joints; no deformity noted, no edema; no cyanosis SKIN: Normal color for age and race; warm; no rash on exposed skin NEURO: Moves all extremities equally, normal speech, normal gait PSYCH: The patient's mood and manner are appropriate.  ____________________________________________   LABS (all labs ordered are listed, but only abnormal results are displayed)  Labs Reviewed  RESP PANEL BY RT-PCR (FLU A&B, COVID) [id number]   ____________________________________________  EKG   ____________________________________________  RADIOLOGY I, Cannan Beeck, personally viewed and evaluated these images (plain radiographs) as part of my medical decision making, as well as reviewing the written report by the radiologist.  ED MD interpretation: Clear chest x-ray.  Official radiology report(s): DG Chest 2 View  Result Date: 09/16/2020 CLINICAL DATA:  Cough EXAM: CHEST - 2 VIEW COMPARISON:  July 20, 2019 FINDINGS: The heart size and  mediastinal contours are within normal limits. Both lungs are clear. The visualized skeletal structures are unremarkable. Cholecystectomy clips. IMPRESSION: No acute cardiopulmonary disease. Electronically Signed   By: Maudry Mayhew MD   On: 09/16/2020 23:33    ____________________________________________   PROCEDURES  Procedure(s) performed (including Critical Care):  Procedures    ____________________________________________   INITIAL IMPRESSION / ASSESSMENT AND PLAN / ED COURSE  As part of my medical decision making, I reviewed the following data within the electronic MEDICAL RECORD NUMBER Nursing notes reviewed and incorporated, Old chart reviewed, Radiograph reviewed  and Notes from prior ED visits         Patient here with viral URI symptoms.  Chest x-ray clear.  She is well-appearing.  Doubt meningitis, pneumonia, ACS, PE, dissection.  She does not appear septic.  Will give ibuprofen for symptomatic relief.  Have offered COVID and flu testing which she agrees to.  She can follow-up on these results through MyChart.  Discussed that she does not need antibiotics this time.  Discharged with Jerilynn Som.   At this time, I do not feel there is any life-threatening condition present. I have reviewed,  interpreted and discussed all results (EKG, imaging, lab, urine as appropriate) and exam findings with patient/family. I have reviewed nursing notes and appropriate previous records.  I feel the patient is safe to be discharged home without further emergent workup and can continue workup as an outpatient as needed. Discussed usual and customary return precautions. Patient/family verbalize understanding and are comfortable with this plan.  Outpatient follow-up has been provided as needed. All questions have been answered.   ED PROGRESS  Patient's COVID and flu tests are negative. ____________________________________________   FINAL CLINICAL IMPRESSION(S) / ED DIAGNOSES  Final  diagnoses:  Viral URI with cough     ED Discharge Orders         Ordered    benzonatate (TESSALON PERLES) 100 MG capsule  3 times daily PRN        09/17/20 0343          *Please note:  Candice Lewis was evaluated in Emergency Department on 09/17/2020 for the symptoms described in the history of present illness. She was evaluated in the context of the global COVID-19 pandemic, which necessitated consideration that the patient might be at risk for infection with the SARS-CoV-2 virus that causes COVID-19. Institutional protocols and algorithms that pertain to the evaluation of patients at risk for COVID-19 are in a state of rapid change based on information released by regulatory bodies including the CDC and federal and state organizations. These policies and algorithms were followed during the patient's care in the ED.  Some ED evaluations and interventions may be delayed as a result of limited staffing during and the pandemic.*   Note:  This document was prepared using Dragon voice recognition software and may include unintentional dictation errors.   Jaquavian Firkus, Layla Maw, DO 09/17/20 [phone]

## 2020-12-11 ENCOUNTER — Emergency Department
Admission: EM | Admit: 2020-12-11 | Discharge: 2020-12-11 | Disposition: A | Discharge status: Home / Self Care | Payer: Medicaid Other | Attending: Emergency Medicine | Admitting: Emergency Medicine

## 2020-12-11 ENCOUNTER — Other Ambulatory Visit: Payer: Self-pay

## 2020-12-11 DIAGNOSIS — J45909 Unspecified asthma, uncomplicated: Secondary | ICD-10-CM | POA: Insufficient documentation

## 2020-12-11 DIAGNOSIS — O26899 Other specified pregnancy related conditions, unspecified trimester: Secondary | ICD-10-CM | POA: Insufficient documentation

## 2020-12-11 DIAGNOSIS — Z3A00 Weeks of gestation of pregnancy not specified: Secondary | ICD-10-CM | POA: Insufficient documentation

## 2020-12-11 DIAGNOSIS — R110 Nausea: Secondary | ICD-10-CM | POA: Insufficient documentation

## 2020-12-11 DIAGNOSIS — R1030 Lower abdominal pain, unspecified: Secondary | ICD-10-CM | POA: Insufficient documentation

## 2020-12-11 DIAGNOSIS — Z91040 Latex allergy status: Secondary | ICD-10-CM | POA: Insufficient documentation

## 2020-12-11 DIAGNOSIS — Z3490 Encounter for supervision of normal pregnancy, unspecified, unspecified trimester: Secondary | ICD-10-CM

## 2020-12-11 LAB — CBC
HCT: 41.6 % (ref 36.0–46.0)
Hemoglobin: 13.6 g/dL (ref 12.0–15.0)
MCH: 25.6 pg — ABNORMAL LOW (ref 26.0–34.0)
MCHC: 32.7 g/dL (ref 30.0–36.0)
MCV: 78.2 fL — ABNORMAL LOW (ref 80.0–100.0)
Platelets: 337 10*3/uL (ref 150–400)
RBC: 5.32 MIL/uL — ABNORMAL HIGH (ref 3.87–5.11)
RDW: 15.8 % — ABNORMAL HIGH (ref 11.5–15.5)
WBC: 5.6 10*3/uL (ref 4.0–10.5)
nRBC: 0 % (ref 0.0–0.2)

## 2020-12-11 LAB — COMPREHENSIVE METABOLIC PANEL
ALT: 12 U/L (ref 0–44)
AST: 18 U/L (ref 15–41)
Albumin: 3.8 g/dL (ref 3.5–5.0)
Alkaline Phosphatase: 71 U/L (ref 38–126)
Anion gap: 7 (ref 5–15)
BUN: 13 mg/dL (ref 6–20)
CO2: 24 mmol/L (ref 22–32)
Calcium: 9.1 mg/dL (ref 8.9–10.3)
Chloride: 107 mmol/L (ref 98–111)
Creatinine, Ser: 0.91 mg/dL (ref 0.44–1.00)
GFR, Estimated: >60 mL/min (ref >60)
Glucose, Bld: 87 mg/dL (ref 70–99)
Potassium: 4.1 mmol/L (ref 3.5–5.1)
Sodium: 138 mmol/L (ref 135–145)
Total Bilirubin: 0.8 mg/dL (ref 0.3–1.2)
Total Protein: 7.2 g/dL (ref 6.5–8.1)

## 2020-12-11 LAB — POC URINE PREG, ED
Preg Test, Ur: NEGATIVE
Preg Test, Ur: POSITIVE — AB

## 2020-12-11 LAB — URINALYSIS, COMPLETE (UACMP) WITH MICROSCOPIC
Bilirubin Urine: NEGATIVE
Glucose, UA: NEGATIVE mg/dL
Hgb urine dipstick: NEGATIVE
Ketones, ur: 5 mg/dL — AB
Leukocytes,Ua: NEGATIVE
Nitrite: NEGATIVE
Protein, ur: NEGATIVE mg/dL
Specific Gravity, Urine: 1.021 (ref 1.005–1.030)
pH: 6 (ref 5.0–8.0)

## 2020-12-11 LAB — LIPASE, BLOOD: Lipase: 31 U/L (ref 11–51)

## 2020-12-11 LAB — HCG, QUANTITATIVE, PREGNANCY: hCG, Beta Chain, Quant, S: 18834 m[IU]/mL — ABNORMAL HIGH (ref <5)

## 2020-12-11 NOTE — ED Provider Notes (Signed)
[facility] Emergency Department Provider Note   ____________________________________________   Event Date/Time   First MD Initiated Contact with Patient 12/11/20 1546     (approximate)  I have reviewed the triage vital signs and the nursing notes.   HISTORY  Chief Complaint Abdominal Pain and Nausea    HPI Candice Lewis is a 25 y.o. female suprapubic abdominal pain and nausea for the last 3 weeks  LOCATION: Suprapubic abdominal region DURATION: 3 weeks TIMING: Stable since onset SEVERITY: Mild QUALITY: Aching CONTEXT: Patient states she began feeling some discomfort in her suprapubic region over the last 3 weeks MODIFYING FACTORS: Denies any exacerbating or relieving factors ASSOCIATED SYMPTOMS: Nausea without vomiting   Per medical record review, history noncontributory          Past Medical History:  Diagnosis Date   Asthma    WELL CONTROLLED-NO INHALERS CURRENTLY    Gallstones    GERD (gastroesophageal reflux disease)    OCC-NO MEDS   Migraine    Migraine    Seizures (HCC)    POSSIBLE AT AGE 75 OR 13 X 1-PT STATES UNSURE IF IT WAS A DEFINITE SEIZURE    Patient Active Problem List   Diagnosis Date Noted   History of migraine headaches 07/15/2018   Unintentional poisoning by aspirin 07/14/2018   Cannabis use disorder, moderate, dependence (HCC) 07/14/2018   Biliary colic    Abdominal pain, right upper quadrant 03/09/2017   Positive GBS test 01/09/2017   Pregnancy 12/17/2016   Obesity (BMI 30.0-34.9) 06/23/2016   Spontaneous vaginal delivery 06/23/2016   Seizures (HCC) 06/20/2016   Asthma 01/10/2013   Migraine with aura 01/10/2013    Past Surgical History:  Procedure Laterality Date   CHOLECYSTECTOMY N/A 03/24/2017   Procedure: LAPAROSCOPIC CHOLECYSTECTOMY;  Surgeon: Ricarda Frame, MD;  Location: [facility] ORS;  Service: General;  Laterality: N/A;   WISDOM TOOTH EXTRACTION      Prior to Admission medications    Medication Sig Start Date End Date Taking? Authorizing Provider  albuterol (VENTOLIN HFA) 108 (90 Base) MCG/ACT inhaler Inhale 2 puffs into the lungs every 6 (six) hours as needed for wheezing or shortness of breath. 07/20/19   Orvil Feil, PA-C    Allergies Ondansetron, Latex, and Zofran [ondansetron hcl]  Family History  Problem Relation Age of Onset   Gallstones Mother     Social History Social History   Tobacco Use   Smoking status: Never   Smokeless tobacco: Never  Vaping Use   Vaping Use: Never used  Substance Use Topics   Alcohol use: No    Comment: socially   Drug use: No    Review of Systems Constitutional: No fever/chills Eyes: No visual changes. ENT: No sore throat. Cardiovascular: Denies chest pain. Respiratory: Denies shortness of breath. Gastrointestinal: Endorses suprapubic abdominal pain nausea, and vomiting.  No diarrhea. Genitourinary: Negative for dysuria. Musculoskeletal: Negative for acute arthralgias Skin: Negative for rash. Neurological: Negative for headaches, weakness/numbness/paresthesias in any extremity Psychiatric: Negative for suicidal ideation/homicidal ideation   ____________________________________________   PHYSICAL EXAM:  VITAL SIGNS: ED Triage Vitals [12/11/20 1418]  Enc Vitals Group     BP 119/65     Pulse Rate 75     Resp 18     Temp 98.3 F (36.8 C)     Temp Source Oral     SpO2 97 %     Weight 196 lb (88.9 kg)     Height 5' (1.524 m)  Head Circumference      Peak Flow      Pain Score 7     Pain Loc      Pain Edu?      Excl. in GC?    Constitutional: Alert and oriented. Well appearing and in no acute distress. Eyes: Conjunctivae are normal. PERRL. Head: Atraumatic. Nose: No congestion/rhinnorhea. Mouth/Throat: Mucous membranes are moist. Neck: No stridor Cardiovascular: Grossly normal heart sounds.  Good peripheral circulation. Respiratory: Normal respiratory effort.  No  retractions. Gastrointestinal: Soft and nontender. No distention. Musculoskeletal: No obvious deformities Neurologic:  Normal speech and language. No gross focal neurologic deficits are appreciated. Skin:  Skin is warm and dry. No rash noted. Psychiatric: Mood and affect are normal. Speech and behavior are normal.  ____________________________________________   LABS (all labs ordered are listed, but only abnormal results are displayed)  Labs Reviewed  CBC - Abnormal; Notable for the following components:      Result Value   RBC 5.32 (*)    MCV 78.2 (*)    MCH 25.6 (*)    RDW 15.8 (*)    All other components within normal limits  URINALYSIS, COMPLETE (UACMP) WITH MICROSCOPIC - Abnormal; Notable for the following components:   Color, Urine YELLOW (*)    APPearance HAZY (*)    Ketones, ur 5 (*)    Bacteria, UA RARE (*)    All other components within normal limits  HCG, QUANTITATIVE, PREGNANCY - Abnormal; Notable for the following components:   hCG, Beta Chain, Quant, S 18,834 (*)    All other components within normal limits  POC URINE PREG, ED - Abnormal; Notable for the following components:   Preg Test, Ur POSITIVE (*)    All other components within normal limits  LIPASE, BLOOD  COMPREHENSIVE METABOLIC PANEL  POC URINE PREG, ED   PROCEDURES  Procedure(s) performed (including Critical Care):  .1-3 Lead EKG Interpretation  Date/Time: 12/11/2020 7:46 PM Performed by: Merwyn Katos, MD Authorized by: Merwyn Katos, MD     Interpretation: normal     ECG rate:  74   ECG rate assessment: normal     Rhythm: sinus rhythm     Ectopy: none     Conduction: normal     ____________________________________________   INITIAL IMPRESSION / ASSESSMENT AND PLAN / ED COURSE  As part of my medical decision making, I reviewed the following data within the electronic medical record, if available:  Nursing notes reviewed and incorporated, Labs reviewed, EKG interpreted, Old chart  reviewed, Radiograph reviewed and Notes from prior ED visits reviewed and incorporated      Patients symptoms not typical for emergent causes of abdominal pain such as, but not limited to, appendicitis, abdominal aortic aneurysm, surgical biliary disease, pancreatitis, SBO, mesenteric ischemia, serious intra-abdominal bacterial illness. Presentation also not typical of gynecologic emergencies such as TOA, Ovarian Torsion, PID. Not Ectopic. Doubt atypical ACS. Patient has a positive pregnancy test and was informed.  Patient states that she is not sure whether she wants to carry this baby to term or not and wants to follow-up with her OB/GYN after discussion with her significant other. Pt tolerating PO. Disposition: Patient will be discharged with strict return precautions and follow up with primary MD within 12-24 hours for further evaluation. Patient understands that this still may have an early presentation of an emergent medical condition such as appendicitis that will require a recheck.      ____________________________________________   FINAL CLINICAL IMPRESSION(S) /  ED DIAGNOSES  Final diagnoses:  Lower abdominal pain  Pregnancy, unspecified gestational age     ED Discharge Orders     None        Note:  This document was prepared using Dragon voice recognition software and may include unintentional dictation errors.    Merwyn Katos, MD 12/11/20 1946

## 2020-12-11 NOTE — ED Notes (Signed)
See triage note  Presents with generalized  abd pain with some nausea  sxs' started 3 days ago  Abd is soft and non tender

## 2020-12-11 NOTE — ED Triage Notes (Signed)
Pt here with abd and nausea x3 weeks. Pt states pain is in her lower abd and has been constant. Pt denies vomitting and is having normal bowel movements. Pt stable in triage.

## 2020-12-11 NOTE — ED Notes (Signed)
POC urine result is positive. Negative result entered in error.

## 2020-12-15 ENCOUNTER — Encounter: Payer: Self-pay | Admitting: Emergency Medicine

## 2020-12-15 ENCOUNTER — Emergency Department
Admission: EM | Admit: 2020-12-15 | Discharge: 2020-12-16 | Disposition: A | Discharge status: Home / Self Care | Payer: Medicaid Other | Attending: Emergency Medicine | Admitting: Emergency Medicine

## 2020-12-15 ENCOUNTER — Other Ambulatory Visit: Payer: Self-pay

## 2020-12-15 ENCOUNTER — Emergency Department: Payer: Medicaid Other

## 2020-12-15 DIAGNOSIS — Z5321 Procedure and treatment not carried out due to patient leaving prior to being seen by health care provider: Secondary | ICD-10-CM | POA: Insufficient documentation

## 2020-12-15 DIAGNOSIS — N939 Abnormal uterine and vaginal bleeding, unspecified: Secondary | ICD-10-CM

## 2020-12-15 DIAGNOSIS — O209 Hemorrhage in early pregnancy, unspecified: Secondary | ICD-10-CM | POA: Insufficient documentation

## 2020-12-15 DIAGNOSIS — O26891 Other specified pregnancy related conditions, first trimester: Secondary | ICD-10-CM | POA: Insufficient documentation

## 2020-12-15 DIAGNOSIS — R109 Unspecified abdominal pain: Secondary | ICD-10-CM | POA: Insufficient documentation

## 2020-12-15 LAB — CBC
HCT: 39.7 % (ref 36.0–46.0)
Hemoglobin: 13.1 g/dL (ref 12.0–15.0)
MCH: 26.3 pg (ref 26.0–34.0)
MCHC: 33 g/dL (ref 30.0–36.0)
MCV: 79.6 fL — ABNORMAL LOW (ref 80.0–100.0)
Platelets: 310 10*3/uL (ref 150–400)
RBC: 4.99 MIL/uL (ref 3.87–5.11)
RDW: 15.6 % — ABNORMAL HIGH (ref 11.5–15.5)
WBC: 5.8 10*3/uL (ref 4.0–10.5)
nRBC: 0 % (ref 0.0–0.2)

## 2020-12-15 LAB — COMPREHENSIVE METABOLIC PANEL
ALT: 10 U/L (ref 0–44)
AST: 16 U/L (ref 15–41)
Albumin: 3.8 g/dL (ref 3.5–5.0)
Alkaline Phosphatase: 70 U/L (ref 38–126)
Anion gap: 7 (ref 5–15)
BUN: 17 mg/dL (ref 6–20)
CO2: 23 mmol/L (ref 22–32)
Calcium: 8.7 mg/dL — ABNORMAL LOW (ref 8.9–10.3)
Chloride: 106 mmol/L (ref 98–111)
Creatinine, Ser: 0.92 mg/dL (ref 0.44–1.00)
GFR, Estimated: >60 mL/min (ref >60)
Glucose, Bld: 91 mg/dL (ref 70–99)
Potassium: 3.9 mmol/L (ref 3.5–5.1)
Sodium: 136 mmol/L (ref 135–145)
Total Bilirubin: 0.4 mg/dL (ref 0.3–1.2)
Total Protein: 7.4 g/dL (ref 6.5–8.1)

## 2020-12-15 LAB — ABO/RH: ABO/RH(D): O POS

## 2020-12-15 LAB — HCG, QUANTITATIVE, PREGNANCY: hCG, Beta Chain, Quant, S: 52866 m[IU]/mL — ABNORMAL HIGH (ref <5)

## 2020-12-15 LAB — LIPASE, BLOOD: Lipase: 37 U/L (ref 11–51)

## 2020-12-15 NOTE — ED Triage Notes (Signed)
Pt called from [facility] to treatment room, no response 

## 2020-12-15 NOTE — ED Triage Notes (Signed)
Pt reports is about [redacted] weeks pregnant and this am started with abd pain and some spotting.

## 2020-12-15 NOTE — ED Notes (Signed)
Pt called by ultrasound. Pt did not answer. Pt not in the lobby or outside

## 2020-12-16 ENCOUNTER — Emergency Department: Payer: Medicaid Other

## 2020-12-16 ENCOUNTER — Emergency Department
Admission: EM | Admit: 2020-12-16 | Discharge: 2020-12-16 | Disposition: A | Discharge status: Home / Self Care | Payer: Medicaid Other | Attending: Emergency Medicine | Admitting: Emergency Medicine

## 2020-12-16 ENCOUNTER — Other Ambulatory Visit: Payer: Self-pay

## 2020-12-16 ENCOUNTER — Encounter: Payer: Self-pay | Admitting: Emergency Medicine

## 2020-12-16 DIAGNOSIS — R1030 Lower abdominal pain, unspecified: Secondary | ICD-10-CM

## 2020-12-16 DIAGNOSIS — Z91040 Latex allergy status: Secondary | ICD-10-CM | POA: Insufficient documentation

## 2020-12-16 DIAGNOSIS — O200 Threatened abortion: Secondary | ICD-10-CM

## 2020-12-16 DIAGNOSIS — J45909 Unspecified asthma, uncomplicated: Secondary | ICD-10-CM | POA: Insufficient documentation

## 2020-12-16 DIAGNOSIS — K219 Gastro-esophageal reflux disease without esophagitis: Secondary | ICD-10-CM | POA: Insufficient documentation

## 2020-12-16 DIAGNOSIS — Z3A01 Less than 8 weeks gestation of pregnancy: Secondary | ICD-10-CM | POA: Insufficient documentation

## 2020-12-16 MED ORDER — METOCLOPRAMIDE HCL 5 MG/ML IJ SOLN
10.0000 mg | Freq: Once | INTRAMUSCULAR | Status: AC
  Administered 2020-12-16: 10 mg via INTRAVENOUS
  Filled 2020-12-16: qty 2

## 2020-12-16 MED ORDER — SODIUM CHLORIDE 0.9 % IV BOLUS
1000.0000 mL | Freq: Once | INTRAVENOUS | Status: AC
  Administered 2020-12-16: 1000 mL via INTRAVENOUS

## 2020-12-16 NOTE — ED Notes (Signed)
See triage note  Presents with some vaginal bleeding  States more like spotting  Also has been having n/v  Pt is [redacted] weeks pregnant

## 2020-12-16 NOTE — ED Triage Notes (Signed)
Pt reports is [redacted] weeks pregnant with NV. Pt states was here yesterday but left due to the wait time. Pt reports does not feel any better.

## 2020-12-16 NOTE — ED Triage Notes (Signed)
Pt reports some light spotting yesterday but none today

## 2020-12-16 NOTE — ED Provider Notes (Signed)
[facility] Emergency Department Provider Note  Time seen: 1:54 PM  I have reviewed the triage vital signs and the nursing notes.   HISTORY  Chief Complaint Emesis, Nausea, and Abdominal Pain   HPI Candice Lewis is a 25 y.o. female G2, P1 with a past medical history of asthma, gallstones, gastric reflux, migraines, presents to the emergency department for vaginal spotting approximately 8 or [redacted] weeks pregnant along with nausea and vomiting.  According to the patient the past several days she has had very slight vaginal spotting, states for the last few days she has also noticed occasional sharp pains to her lower abdomen.  Largely negative review of systems otherwise.  Patient came to the emergency department last night had lab work performed but left due to a prolonged wait.   Past Medical History:  Diagnosis Date   Asthma    WELL CONTROLLED-NO INHALERS CURRENTLY    Gallstones    GERD (gastroesophageal reflux disease)    OCC-NO MEDS   Migraine    Migraine    Seizures (HCC)    POSSIBLE AT AGE 62 OR 13 X 1-PT STATES UNSURE IF IT WAS A DEFINITE SEIZURE    Patient Active Problem List   Diagnosis Date Noted   History of migraine headaches 07/15/2018   Unintentional poisoning by aspirin 07/14/2018   Cannabis use disorder, moderate, dependence (HCC) 07/14/2018   Biliary colic    Abdominal pain, right upper quadrant 03/09/2017   Positive GBS test 01/09/2017   Pregnancy 12/17/2016   Obesity (BMI 30.0-34.9) 06/23/2016   Spontaneous vaginal delivery 06/23/2016   Seizures (HCC) 06/20/2016   Asthma 01/10/2013   Migraine with aura 01/10/2013    Past Surgical History:  Procedure Laterality Date   CHOLECYSTECTOMY N/A 03/24/2017   Procedure: LAPAROSCOPIC CHOLECYSTECTOMY;  Surgeon: Ricarda Frame, MD;  Location: [facility] ORS;  Service: General;  Laterality: N/A;   WISDOM TOOTH EXTRACTION      Prior to Admission medications   Medication Sig Start Date End Date  Taking? Authorizing Provider  albuterol (VENTOLIN HFA) 108 (90 Base) MCG/ACT inhaler Inhale 2 puffs into the lungs every 6 (six) hours as needed for wheezing or shortness of breath. 07/20/19   Orvil Feil, PA-C    Allergies  Allergen Reactions   Ondansetron Other (See Comments)    AMS   Latex Rash   Zofran [Ondansetron Hcl] Nausea And Vomiting, Rash and Other (See Comments)    AMS    Family History  Problem Relation Age of Onset   Gallstones Mother     Social History Social History   Tobacco Use   Smoking status: Never   Smokeless tobacco: Never  Vaping Use   Vaping Use: Never used  Substance Use Topics   Alcohol use: No    Comment: socially   Drug use: No    Review of Systems Constitutional: Negative for fever. Cardiovascular: Negative for chest pain. Respiratory: Negative for shortness of breath. Gastrointestinal: Intermittent lower abdominal pains.  None currently.  Positive for nausea and vomiting. Genitourinary: Positive for vaginal spotting. Musculoskeletal: Negative for musculoskeletal complaints Neurological: Negative for headache All other ROS negative  ____________________________________________   PHYSICAL EXAM:  VITAL SIGNS: ED Triage Vitals  Enc Vitals Group     BP 12/16/20 1230 105/68     Pulse Rate 12/16/20 1230 60     Resp 12/16/20 1230 18     Temp 12/16/20 1230 98.3 F (36.8 C)     Temp Source 12/16/20 1230  Oral     SpO2 12/16/20 1230 98 %     Weight 12/16/20 1147 196 lb (88.9 kg)     Height 12/16/20 1147 5' (1.524 m)     Head Circumference --      Peak Flow --      Pain Score 12/16/20 1147 9     Pain Loc --      Pain Edu? --      Excl. in GC? --    Constitutional: Alert and oriented. Well appearing and in no distress. Eyes: Normal exam ENT      Head: Normocephalic and atraumatic.      Mouth/Throat: Mucous membranes are moist. Cardiovascular: Normal rate, regular rhythm.  Respiratory: Normal respiratory effort without  tachypnea nor retractions. Breath sounds are clear  Gastrointestinal: Soft and nontender. No distention.   Musculoskeletal: Nontender with normal range of motion in all extremities.  Neurologic:  Normal speech and language. No gross focal neurologic deficits  Skin:  Skin is warm, dry and intact.  Psychiatric: Mood and affect are normal.  ____________________________________________   RADIOLOGY  6-week 1 day IUP.  ____________________________________________   INITIAL IMPRESSION / ASSESSMENT AND PLAN / ED COURSE  Pertinent labs & imaging results that were available during my care of the patient were reviewed by me and considered in my medical decision making (see chart for details).   Patient presents emergency department for vaginal spotting and intermittent sharp pains lower abdomen approximately 8 or [redacted] weeks pregnant per patient per LMP.  Patient has also been very nauseated and unable to keep down fluids today..  Overall patient appears well, no significant abdominal tenderness to palpation.  No rebound guarding or distention.  Patient's lab work performed yesterday is largely within normal limits with a beta-hCG of 52,000, O+ blood type does not require RhoGAM.  We will obtain an ultrasound, IV hydrate and treat nausea.  Patient agreeable to plan of care.  6-week 1 day IUP.  Patient feeling better is requesting discharge home.  Patient follow-up with OB/GYN.  Candice Lewis was evaluated in Emergency Department on 12/16/2020 for the symptoms described in the history of present illness. She was evaluated in the context of the global COVID-19 pandemic, which necessitated consideration that the patient might be at risk for infection with the SARS-CoV-2 virus that causes COVID-19. Institutional protocols and algorithms that pertain to the evaluation of patients at risk for COVID-19 are in a state of rapid change based on information released by regulatory bodies including the CDC and federal  and state organizations. These policies and algorithms were followed during the patient's care in the ED.  ____________________________________________   FINAL CLINICAL IMPRESSION(S) / ED DIAGNOSES  Threatened miscarriage    Minna Antis, MD 12/16/20 [phone]

## 2020-12-16 NOTE — Discharge Instructions (Addendum)
Your work-up today shows a 6-week 1 day pregnancy.  Please follow-up with OB/GYN within the next 2 to 3 weeks for recheck/reevaluation.  Return to the emergency department for any symptom personally concerning to yourself.

## 2020-12-16 NOTE — ED Notes (Signed)
Pt requesting discharge papers due to her ride having to leave. Dr. Lenard Lance notified.

## 2021-02-11 ENCOUNTER — Emergency Department
Admission: EM | Admit: 2021-02-11 | Discharge: 2021-02-11 | Disposition: A | Discharge status: Home / Self Care | Payer: Medicaid Other | Attending: Emergency Medicine | Admitting: Emergency Medicine

## 2021-02-11 ENCOUNTER — Encounter: Payer: Self-pay | Admitting: *Deleted

## 2021-02-11 ENCOUNTER — Other Ambulatory Visit: Payer: Self-pay

## 2021-02-11 DIAGNOSIS — Z7951 Long term (current) use of inhaled steroids: Secondary | ICD-10-CM | POA: Insufficient documentation

## 2021-02-11 DIAGNOSIS — J039 Acute tonsillitis, unspecified: Secondary | ICD-10-CM | POA: Insufficient documentation

## 2021-02-11 DIAGNOSIS — J45909 Unspecified asthma, uncomplicated: Secondary | ICD-10-CM | POA: Insufficient documentation

## 2021-02-11 DIAGNOSIS — Z91040 Latex allergy status: Secondary | ICD-10-CM | POA: Insufficient documentation

## 2021-02-11 MED ORDER — AMOXICILLIN 400 MG/5ML PO SUSR: 500.0000 mg | Freq: Three times a day (TID) | ORAL | 0 refills | Status: AC

## 2021-02-11 MED ORDER — IBUPROFEN 100 MG/5ML PO SUSP
600.0000 mg | Freq: Once | ORAL | Status: AC
  Administered 2021-02-11: 600 mg via ORAL
  Filled 2021-02-11 (×2): qty 30

## 2021-02-11 MED ORDER — AMOXICILLIN 250 MG/5ML PO SUSR
500.0000 mg | Freq: Once | ORAL | Status: AC
  Administered 2021-02-11: 500 mg via ORAL
  Filled 2021-02-11: qty 10

## 2021-02-11 NOTE — ED Notes (Signed)
Messaged pharm about missing med. Will d/c pt once med received and given.

## 2021-02-11 NOTE — ED Triage Notes (Signed)
Pt states her tonsils are swollen and she has a sore throat and bil earache.  Sx began this am  pt alert  speech clear.

## 2021-02-11 NOTE — Discharge Instructions (Signed)
Take tylenol or ibuprofen for pain or fever. Follow up with primary care or return to the ER for symptoms of concern. 

## 2021-02-11 NOTE — ED Notes (Signed)
Pt states will continue to monitor for fever at home and take tylenol as needed for it.

## 2021-02-11 NOTE — ED Provider Notes (Signed)
[facility] Emergency Department Provider Note  ____________________________________________  Time seen: Approximately 9:24 PM  I have reviewed the triage vital signs and the nursing notes.   HISTORY  Chief Complaint Sore Throat    HPI Candice Lewis is a 25 y.o. female presents to the emergency department for treatment and evaluation of sore throat and bilateral earache.  Patient states that she has had sinus congestion for a few days and upon awakening this morning, her tonsils were swollen and very sore.  She states she has a history of frequent tonsillitis and this feels very much the same.  She states that she tried to take a Tylenol but was too difficult to swallow a pill.  Past Medical History:  Diagnosis Date   Asthma    WELL CONTROLLED-NO INHALERS CURRENTLY    Gallstones    GERD (gastroesophageal reflux disease)    OCC-NO MEDS   Migraine    Migraine    Seizures (HCC)    POSSIBLE AT AGE 70 OR 13 X 1-PT STATES UNSURE IF IT WAS A DEFINITE SEIZURE    Patient Active Problem List   Diagnosis Date Noted   History of migraine headaches 07/15/2018   Unintentional poisoning by aspirin 07/14/2018   Cannabis use disorder, moderate, dependence (HCC) 07/14/2018   Biliary colic    Abdominal pain, right upper quadrant 03/09/2017   Positive GBS test 01/09/2017   Pregnancy 12/17/2016   Obesity (BMI 30.0-34.9) 06/23/2016   Spontaneous vaginal delivery 06/23/2016   Seizures (HCC) 06/20/2016   Asthma 01/10/2013   Migraine with aura 01/10/2013    Past Surgical History:  Procedure Laterality Date   CHOLECYSTECTOMY N/A 03/24/2017   Procedure: LAPAROSCOPIC CHOLECYSTECTOMY;  Surgeon: Ricarda Frame, MD;  Location: [facility] ORS;  Service: General;  Laterality: N/A;   WISDOM TOOTH EXTRACTION      Prior to Admission medications   Medication Sig Start Date End Date Taking? Authorizing Provider  amoxicillin (AMOXIL) 400 MG/5ML suspension Take 6.3 mLs (500 mg  total) by mouth 3 (three) times daily for 10 days. 02/11/21 02/21/21 Yes Jaryan Chicoine B, FNP  albuterol (VENTOLIN HFA) 108 (90 Base) MCG/ACT inhaler Inhale 2 puffs into the lungs every 6 (six) hours as needed for wheezing or shortness of breath. 07/20/19   Orvil Feil, PA-C    Allergies Ondansetron, Latex, and Zofran [ondansetron hcl]  Family History  Problem Relation Age of Onset   Gallstones Mother     Social History Social History   Tobacco Use   Smoking status: Never   Smokeless tobacco: Never  Vaping Use   Vaping Use: Never used  Substance Use Topics   Alcohol use: No    Comment: socially   Drug use: No    Review of Systems Constitutional: Negative for fever. Eyes: No visual changes. ENT: Positive for sore throat; negative for difficulty swallowing.  Positive for bilateral earache. Respiratory: Denies shortness of breath. Gastrointestinal: Negative for abdominal pain.  No nausea, no vomiting.  No diarrhea.  Genitourinary: Negative for dysuria.  Negative for decrease in need to void. Musculoskeletal: Negative for generalized body aches. Skin: Negative for rash. Neurological: Negative for headaches, negative for focal weakness or numbness.  ____________________________________________   PHYSICAL EXAM:  VITAL SIGNS: ED Triage Vitals [02/11/21 2032]  Enc Vitals Group     BP 124/84     Pulse Rate 93     Resp 20     Temp 99.9 F (37.7 C)     Temp Source Oral  SpO2 99 %     Weight 196 lb (88.9 kg)     Height 5' (1.524 m)     Head Circumference      Peak Flow      Pain Score 8     Pain Loc      Pain Edu?      Excl. in GC?     Constitutional: Alert and oriented. Well appearing and in no acute distress. Eyes: Conjunctivae are normal.  Head: Atraumatic. Nose: No congestion/rhinnorhea. Mouth/Throat: Mucous membranes are moist.  Oropharynx erythematous, tonsils 3+ with exudate. Uvula is midline. Ears: Right tympanic membrane appears normal.  Left  tympanic membrane appears normal. Neck: No stridor. Voice clear.  Able to swallow although painful.  Maintaining secretions. Lymphatic: Anterior cervical nodes tender and palpable Cardiovascular: Normal rate, regular rhythm. Good peripheral circulation. Respiratory: Normal respiratory effort. Lungs CTAB. Gastrointestinal: Soft and nontender. Musculoskeletal: FROM of neck, upper and lower extremities. Neurologic:  Normal speech and language. No gross focal neurologic deficits are appreciated. Skin:  Skin is warm, dry and intact.  No rash noted Psychiatric: Mood and affect are normal. Speech and behavior are normal.  ____________________________________________   LABS (all labs ordered are listed, but only abnormal results are displayed)  Labs Reviewed - No data to display ____________________________________________  EKG  Not indiated ____________________________________________  RADIOLOGY  Not indicated ____________________________________________   PROCEDURES  Procedure(s) performed: None  Critical Care performed: No ____________________________________________   INITIAL IMPRESSION / ASSESSMENT AND PLAN / ED COURSE  25 year old female presenting to the emergency department for treatment and evaluation of sore throat and earache.  See HPI for further details.  On exam, appears that she has tonsillitis as her tonsils are red, swollen and with exudate.  She request to be treated with liquid medications.  Amoxicillin and ibuprofen ordered..  She is to follow-up with primary care provider or return to the emergency department for symptoms of concern.  Pertinent labs & imaging results that were available during my care of the patient were reviewed by me and considered in my medical decision making (see chart for details). ____________________________________________  New Prescriptions   AMOXICILLIN (AMOXIL) 400 MG/5ML SUSPENSION    Take 6.3 mLs (500 mg total) by mouth 3  (three) times daily for 10 days.    FINAL CLINICAL IMPRESSION(S) / ED DIAGNOSES  Final diagnoses:  Exudative tonsillitis    If controlled substance prescribed during this visit, 12 month history viewed on the [facility] prior to issuing an initial prescription for Schedule II or III opiod.   Note:  This document was prepared using Dragon voice recognition software and may include unintentional dictation errors.    Chinita Pester, FNP 02/11/21 2145    Phineas Semen, MD 02/11/21 2158

## 2021-04-13 ENCOUNTER — Other Ambulatory Visit: Payer: Self-pay

## 2021-04-13 ENCOUNTER — Emergency Department
Admission: EM | Admit: 2021-04-13 | Discharge: 2021-04-13 | Disposition: A | Discharge status: Home / Self Care | Payer: Medicaid Other | Attending: Emergency Medicine | Admitting: Emergency Medicine

## 2021-04-13 DIAGNOSIS — Z91040 Latex allergy status: Secondary | ICD-10-CM | POA: Insufficient documentation

## 2021-04-13 DIAGNOSIS — X58XXXA Exposure to other specified factors, initial encounter: Secondary | ICD-10-CM | POA: Insufficient documentation

## 2021-04-13 DIAGNOSIS — S39012A Strain of muscle, fascia and tendon of lower back, initial encounter: Secondary | ICD-10-CM

## 2021-04-13 DIAGNOSIS — J45909 Unspecified asthma, uncomplicated: Secondary | ICD-10-CM | POA: Insufficient documentation

## 2021-04-13 LAB — URINALYSIS, ROUTINE W REFLEX MICROSCOPIC
Bilirubin Urine: NEGATIVE
Glucose, UA: NEGATIVE mg/dL
Hgb urine dipstick: NEGATIVE
Ketones, ur: NEGATIVE mg/dL
Nitrite: NEGATIVE
Protein, ur: NEGATIVE mg/dL
Specific Gravity, Urine: 1.016 (ref 1.005–1.030)
pH: 8 (ref 5.0–8.0)

## 2021-04-13 LAB — POC URINE PREG, ED
Preg Test, Ur: NEGATIVE
Preg Test, Ur: NEGATIVE

## 2021-04-13 MED ORDER — KETOROLAC TROMETHAMINE 30 MG/ML IJ SOLN
30.0000 mg | Freq: Once | INTRAMUSCULAR | Status: AC
  Administered 2021-04-13: 30 mg via INTRAMUSCULAR
  Filled 2021-04-13: qty 1

## 2021-04-13 MED ORDER — METHOCARBAMOL 500 MG PO TABS: 500.0000 mg | ORAL_TABLET | Freq: Four times a day (QID) | ORAL | 0 refills | Status: DC | PRN

## 2021-04-13 MED ORDER — KETOROLAC TROMETHAMINE 10 MG PO TABS: 10.0000 mg | ORAL_TABLET | Freq: Four times a day (QID) | ORAL | 0 refills | Status: DC | PRN

## 2021-04-13 NOTE — ED Notes (Signed)
Pt arrives today with back pain that started a few days ago. Pt endorsing "I was sitting on the cough and I got a sharp pain in my back, in the center." Pt endorsing that they have been taking tylenol and ibuprofen with no relief and now that pain has been radiating to her abdomen.

## 2021-04-13 NOTE — ED Triage Notes (Signed)
Patient c/o lower back pain X 4 days, denies injury, denies other symptoms. Patient has been taking OTC pain relievers with no relief.

## 2021-04-13 NOTE — ED Provider Notes (Signed)
[facility] Emergency Department Provider Note  ____________________________________________   Event Date/Time   First MD Initiated Contact with Patient 04/13/21 1252     (approximate)  I have reviewed the triage vital signs and the nursing notes.   HISTORY  Chief Complaint Back Pain   HPI Candice Lewis is a 25 y.o. female resents to the ED with complaint of right-sided low back pain for approximately 4 days.  Patient states she does not normally have back problems and there has been no injury.  She denies any urinary symptoms, history of stones, saddle anesthesias or radiculopathy.  She reports that she has a regular menses and has not had a period since possibly September.  She is not on any birth control.  She rates her pain as an 8 out of 10.      Past Medical History:  Diagnosis Date   Asthma    WELL CONTROLLED-NO INHALERS CURRENTLY    Gallstones    GERD (gastroesophageal reflux disease)    OCC-NO MEDS   Migraine    Migraine    Seizures (HCC)    POSSIBLE AT AGE 55 OR 14 X 1-PT STATES UNSURE IF IT WAS A DEFINITE SEIZURE    Patient Active Problem List   Diagnosis Date Noted   History of migraine headaches 07/15/2018   Unintentional poisoning by aspirin 07/14/2018   Cannabis use disorder, moderate, dependence (HCC) 07/14/2018   Biliary colic    Abdominal pain, right upper quadrant 03/09/2017   Positive GBS test 01/09/2017   Pregnancy 12/17/2016   Obesity (BMI 30.0-34.9) 06/23/2016   Spontaneous vaginal delivery 06/23/2016   Seizures (HCC) 06/20/2016   Asthma 01/10/2013   Migraine with aura 01/10/2013    Past Surgical History:  Procedure Laterality Date   CHOLECYSTECTOMY N/A 03/24/2017   Procedure: LAPAROSCOPIC CHOLECYSTECTOMY;  Surgeon: Ricarda Frame, MD;  Location: [facility] ORS;  Service: General;  Laterality: N/A;   WISDOM TOOTH EXTRACTION      Prior to Admission medications   Medication Sig Start Date End Date Taking? Authorizing  Provider  ketorolac (TORADOL) 10 MG tablet Take 1 tablet (10 mg total) by mouth every 6 (six) hours as needed for moderate pain. 04/13/21  Yes Tommi Rumps, PA-C  methocarbamol (ROBAXIN) 500 MG tablet Take 1 tablet (500 mg total) by mouth every 6 (six) hours as needed. 04/13/21  Yes Tommi Rumps, PA-C  albuterol (VENTOLIN HFA) 108 (90 Base) MCG/ACT inhaler Inhale 2 puffs into the lungs every 6 (six) hours as needed for wheezing or shortness of breath. 07/20/19   Orvil Feil, PA-C    Allergies Ondansetron, Latex, and Zofran [ondansetron hcl]  Family History  Problem Relation Age of Onset   Gallstones Mother     Social History Social History   Tobacco Use   Smoking status: Never   Smokeless tobacco: Never  Vaping Use   Vaping Use: Never used  Substance Use Topics   Alcohol use: No    Comment: socially   Drug use: No    Review of Systems Constitutional: No fever/chills Eyes: No visual changes. Cardiovascular: Denies chest pain. Respiratory: Denies shortness of breath. Gastrointestinal: Positive abdominal cramping.  No nausea, no vomiting.  Genitourinary: Negative for dysuria.  History of irregular menses.  Negative for vaginal discharge. Musculoskeletal: Positive for low back pain. Skin: Negative for rash. Neurological: Negative for headaches, focal weakness or numbness. ____________________________________________   PHYSICAL EXAM:  VITAL SIGNS: ED Triage Vitals  Enc Vitals Group  BP 04/13/21 1128 110/77     Pulse Rate 04/13/21 1128 74     Resp 04/13/21 1128 14     Temp 04/13/21 1128 98.5 F (36.9 C)     Temp src --      SpO2 04/13/21 1128 94 %     Weight 04/13/21 1125 188 lb (85.3 kg)     Height 04/13/21 1125 5' (1.524 m)     Head Circumference --      Peak Flow --      Pain Score 04/13/21 1124 8     Pain Loc --      Pain Edu? --      Excl. in [place]? --     Constitutional: Alert and oriented. Well appearing and in no acute distress. Eyes:  Conjunctivae are normal.  Head: Atraumatic. Neck: No stridor.   Cardiovascular: Normal rate, regular rhythm. Grossly normal heart sounds.  Good peripheral circulation. Respiratory: Normal respiratory effort.  No retractions. Lungs CTAB. Gastrointestinal: Soft and nontender. No distention.  No CVA tenderness. Musculoskeletal: No tenderness on palpation of the thoracic spine however on lower lumbar area L5-S1 there is tenderness and also the paravertebral muscles to the right.  Good muscle strength lower extremities at 5/5.  Increased discomfort with range of motion. Neurologic:  Normal speech and language. No gross focal neurologic deficits are appreciated. No gait instability. Skin:  Skin is warm, dry and intact. No rash noted. Psychiatric: Mood and affect are normal. Speech and behavior are normal.  ____________________________________________   LABS (all labs ordered are listed, but only abnormal results are displayed)  Labs Reviewed  URINALYSIS, ROUTINE W REFLEX MICROSCOPIC - Abnormal; Notable for the following components:      Result Value   Color, Urine YELLOW (*)    APPearance HAZY (*)    Leukocytes,Ua TRACE (*)    All other components within normal limits  POC URINE PREG, ED  POC URINE PREG, ED    PROCEDURES  Procedure(s) performed (including Critical Care):  Procedures   ____________________________________________   INITIAL IMPRESSION / ASSESSMENT AND PLAN / ED COURSE  As part of my medical decision making, I reviewed the following data within the electronic MEDICAL RECORD NUMBER Notes from prior ED visits and [place] Controlled Substance Database  25 year old female presents to the ED with complaint low back pain for the last 4 days.  Patient is unaware of any injury but states she has small child at home.  She is taken some over-the-counter medication without any relief.  Pain is worse with range of motion.  She denies any urinary symptoms.  Menses was in September and she  states that she frequently is irregular.  She denies any vaginal discharge or vaginal pain.  Pregnancy test was negative and while waiting for her urinalysis patient was given ketorolac IM.  Patient began feeling much better and was able to sit with less back pain.  Because she drove herself to the ED muscle relaxants were not given while in the department but a prescription for methocarbamol was sent to her pharmacy for 1 every 6 hours and continue with ketorolac.  She is encouraged to use ice or heat to her lower back as needed for discomfort.  She is to follow-up with her PCP if any continued problems and return to the emergency department if any severe worsening of her symptoms.  ____________________________________________   FINAL CLINICAL IMPRESSION(S) / ED DIAGNOSES  Final diagnoses:  Strain of lumbar region, initial encounter  ED Discharge Orders          Ordered    methocarbamol (ROBAXIN) 500 MG tablet  Every 6 hours PRN        04/13/21 1534    ketorolac (TORADOL) 10 MG tablet  Every 6 hours PRN        04/13/21 1534             Note:  This document was prepared using Dragon voice recognition software and may include unintentional dictation errors.    Tommi Rumps, PA-C 04/13/21 1544    Sharman Cheek, MD 04/15/21 [phone]

## 2021-04-13 NOTE — Discharge Instructions (Signed)
Follow-up with your primary care or urgent care if any continued problems.  You may use ice or heat to your back as needed for discomfort.  2 prescriptions were sent to your pharmacy.  1 is methocarbamol which is the muscle relaxant that I spoke to you about.  Be aware that this could cause drowsiness and increase your risk for injury.  Do not drive or operate machinery while taking this medication.  Ketorolac is an antibiotic inflammatory pain medication that is the same as the injection that she got while in the emergency department.  Make sure that she take this medication with food.

## 2021-04-13 NOTE — ED Notes (Signed)
Patient provided with urine cup to collect specimen.

## 2021-06-18 ENCOUNTER — Emergency Department: Payer: Self-pay

## 2021-06-18 ENCOUNTER — Emergency Department
Admission: EM | Admit: 2021-06-18 | Discharge: 2021-06-18 | Disposition: A | Discharge status: Home / Self Care | Payer: Self-pay | Attending: Emergency Medicine | Admitting: Emergency Medicine

## 2021-06-18 ENCOUNTER — Encounter: Payer: Self-pay | Admitting: Emergency Medicine

## 2021-06-18 ENCOUNTER — Other Ambulatory Visit: Payer: Self-pay

## 2021-06-18 DIAGNOSIS — N3000 Acute cystitis without hematuria: Secondary | ICD-10-CM | POA: Insufficient documentation

## 2021-06-18 DIAGNOSIS — M545 Low back pain, unspecified: Secondary | ICD-10-CM | POA: Insufficient documentation

## 2021-06-18 DIAGNOSIS — R110 Nausea: Secondary | ICD-10-CM | POA: Insufficient documentation

## 2021-06-18 DIAGNOSIS — R1030 Lower abdominal pain, unspecified: Secondary | ICD-10-CM

## 2021-06-18 DIAGNOSIS — N3090 Cystitis, unspecified without hematuria: Secondary | ICD-10-CM

## 2021-06-18 LAB — COMPREHENSIVE METABOLIC PANEL
ALT: 12 U/L (ref 0–44)
AST: 18 U/L (ref 15–41)
Albumin: 3.2 g/dL — ABNORMAL LOW (ref 3.5–5.0)
Alkaline Phosphatase: 53 U/L (ref 38–126)
Anion gap: 5 (ref 5–15)
BUN: 15 mg/dL (ref 6–20)
CO2: 28 mmol/L (ref 22–32)
Calcium: 8.7 mg/dL — ABNORMAL LOW (ref 8.9–10.3)
Chloride: 104 mmol/L (ref 98–111)
Creatinine, Ser: 1.07 mg/dL — ABNORMAL HIGH (ref 0.44–1.00)
GFR, Estimated: >60 mL/min (ref >60)
Glucose, Bld: 83 mg/dL (ref 70–99)
Potassium: 3.9 mmol/L (ref 3.5–5.1)
Sodium: 137 mmol/L (ref 135–145)
Total Bilirubin: 0.6 mg/dL (ref 0.3–1.2)
Total Protein: 5.9 g/dL — ABNORMAL LOW (ref 6.5–8.1)

## 2021-06-18 LAB — URINALYSIS, ROUTINE W REFLEX MICROSCOPIC
Bilirubin Urine: NEGATIVE
Glucose, UA: NEGATIVE mg/dL
Ketones, ur: NEGATIVE mg/dL
Nitrite: NEGATIVE
Protein, ur: NEGATIVE mg/dL
Specific Gravity, Urine: 1.02 (ref 1.005–1.030)
WBC, UA: >50 WBC/hpf — ABNORMAL HIGH (ref 0–5)
pH: 5.5 (ref 5.0–8.0)

## 2021-06-18 LAB — POC URINE PREG, ED: Preg Test, Ur: NEGATIVE

## 2021-06-18 LAB — CBC
HCT: 46.1 % — ABNORMAL HIGH (ref 36.0–46.0)
Hemoglobin: 15.2 g/dL — ABNORMAL HIGH (ref 12.0–15.0)
MCH: 27.2 pg (ref 26.0–34.0)
MCHC: 33 g/dL (ref 30.0–36.0)
MCV: 82.5 fL (ref 80.0–100.0)
Platelets: 255 10*3/uL (ref 150–400)
RBC: 5.59 MIL/uL — ABNORMAL HIGH (ref 3.87–5.11)
RDW: 14.4 % (ref 11.5–15.5)
WBC: 6.9 10*3/uL (ref 4.0–10.5)
nRBC: 0 % (ref 0.0–0.2)

## 2021-06-18 LAB — HCG, QUANTITATIVE, PREGNANCY: hCG, Beta Chain, Quant, S: <1 m[IU]/mL (ref <5)

## 2021-06-18 LAB — LIPASE, BLOOD: Lipase: 32 U/L (ref 11–51)

## 2021-06-18 MED ORDER — LIDOCAINE 5 % EX PTCH: 1.0000 | MEDICATED_PATCH | Freq: Two times a day (BID) | CUTANEOUS | 0 refills | Status: AC

## 2021-06-18 MED ORDER — METOCLOPRAMIDE HCL 10 MG PO TABS: 10.0000 mg | ORAL_TABLET | Freq: Three times a day (TID) | ORAL | 0 refills | Status: DC

## 2021-06-18 MED ORDER — LIDOCAINE 5 % EX PTCH
1.0000 | MEDICATED_PATCH | CUTANEOUS | Status: DC
  Administered 2021-06-18: 12:00:00 1 via TRANSDERMAL
  Filled 2021-06-18: qty 1

## 2021-06-18 MED ORDER — METOCLOPRAMIDE HCL 10 MG PO TABS
10.0000 mg | ORAL_TABLET | Freq: Once | ORAL | Status: AC
Start: 2021-06-18 — End: 2021-06-18
  Administered 2021-06-18: 12:00:00 10 mg via ORAL
  Filled 2021-06-18: qty 1

## 2021-06-18 MED ORDER — CEPHALEXIN 500 MG PO CAPS: 500.0000 mg | ORAL_CAPSULE | Freq: Two times a day (BID) | ORAL | 0 refills | Status: AC

## 2021-06-18 NOTE — Discharge Instructions (Addendum)
There is concern for possible UTI.  We sent it for culture.  I prescribed some medication to help with your symptoms.  SHe can also take Tylenol 1 g every 8 hours.  We discussed the possibility of kidney stone if he develop pain on one side of your back radiating into your groin or high fevers you need to come into the ER to be reevaluated

## 2021-06-18 NOTE — ED Triage Notes (Signed)
Pt here with abd pain. Pt has a hx of cysts and irregular periods. Pt states pain is in her lower abd pain and is generalized. Pt states pain also radiates to her back. Pt denies hx of kidney stones but has N/V. Pt in NAD in triage.

## 2021-06-18 NOTE — ED Notes (Signed)
See triage note  presents with lower abd pain  states pain moves into her lower back  has been nauseated for 1 week  but has some vomiting yesterday  last time vomited was this am around 7a  no fever or diarrhea

## 2021-06-18 NOTE — ED Notes (Signed)
Patient transported to X-ray 

## 2021-06-18 NOTE — ED Provider Notes (Signed)
[facility] Provider Note    Event Date/Time   First MD Initiated Contact with Patient 06/18/21 1100     (approximate)   History   Abdominal Pain   HPI  Candice Lewis is a 26 y.o. female with history of cyst and irregular periods who comes in with lower abdominal pain.  Patient reports having history of PCOS.  She states that she has had some intermittent lower abdominal/lower back pain that is not focused to 1 side.  She states that this feels like when she is had her cyst versus when she has been pregnant previously.  She states that she is late for her period by 2 weeks and is sexually active without any contraception.  She does have some associated nausea with it.  Denies any fevers.  Denies any vaginal discharge and reports having recent STD testing that was reassuring with no new partners  Physical Exam   Triage Vital Signs: ED Triage Vitals [06/18/21 1003]  Enc Vitals Group     BP 113/72     Pulse Rate 70     Resp 16     Temp 97.9 F (36.6 C)     Temp Source Oral     SpO2 100 %     Weight 188 lb (85.3 kg)     Height 5' (1.524 m)     Head Circumference      Peak Flow      Pain Score 8     Pain Loc      Pain Edu?      Excl. in [place]?     Most recent vital signs: Vitals:   06/18/21 1003  BP: 113/72  Pulse: 70  Resp: 16  Temp: 97.9 F (36.6 C)  SpO2: 100%     General: Awake, no distress.  Well-appearing CV:  Good peripheral perfusion.  Resp:  Normal effort.  Abd:  No distention.  No rebound or guarding NO cva tenderness    ED Results / Procedures / Treatments   Labs (all labs ordered are listed, but only abnormal results are displayed) Labs Reviewed  CBC - Abnormal; Notable for the following components:      Result Value   RBC 5.59 (*)    Hemoglobin 15.2 (*)    HCT 46.1 (*)    All other components within normal limits  URINALYSIS, ROUTINE W REFLEX MICROSCOPIC  COMPREHENSIVE METABOLIC PANEL  LIPASE, BLOOD  POC URINE PREG,  ED      RADIOLOGY I have reviewed the [place] personally and agree with radiology read normal    PROCEDURES:  Critical Care performed: No  Procedures   MEDICATIONS ORDERED IN ED: Medications  lidocaine (LIDODERM) 5 % 1 patch (1 patch Transdermal Patch Applied 06/18/21 1210)  metoCLOPramide (REGLAN) tablet 10 mg (10 mg Oral Given 06/18/21 1210)     IMPRESSION / MDM / ASSESSMENT AND PLAN / ED COURSE  I reviewed the triage vital signs and the nursing notes.  Patient comes in with lower abdominal and back pain. Differential diagnosis includes, but is not limited to, ovarian cyst, ovarian cyst rupture, ovarian torsion, pregnancy, ectopic.  Lower suspicion for appendicitis given afebrile and no significant pain upon examination on the right side.  Does not sound consistent with kidney stone.  Patient given some Reglan for her nausea and lidocaine patch for pain.  I asked patient if she had any concerns for STDs and offered STD screening.  She reports that she was just  tested and denies any vaginal discharge or new sexual partners.   Pregnancy test was negative CMP was slightly elevated kidney function but similar to her priors Lipase normal CBC with no evidence of anemia or white count elevation  Ultrasound reassuring  Reevaluated patient.  Urine was concerning for UTI. No symptoms of UTI but does have suprapubic tenderness and nausea so will rx.  Called lab to add on urine culture.  Discussed with patient and she has no history of kidney stones.  She was reporting a little bit of back pain but does not seem to be situated to 1 side.  We discussed getting a CT just to rule out kidney stone but patient would like to hold off states she is coping up her daughter.  She understands that if she develops fevers or pain on one side that she needs to return to the ER for evaluation    FINAL CLINICAL IMPRESSION(S) / ED DIAGNOSES   Final diagnoses:  Lower abdominal pain  Cystitis     Rx /  DC Orders   ED Discharge Orders          Ordered    cephALEXin (KEFLEX) 500 MG capsule  2 times daily        06/18/21 1423    metoCLOPramide (REGLAN) 10 MG tablet  3 times daily with meals        06/18/21 1423    lidocaine (LIDODERM) 5 %  Every 12 hours        06/18/21 1423             Note:  This document was prepared using Dragon voice recognition software and may include unintentional dictation errors.   Vanessa Waterproof, MD 06/18/21 [phone]

## 2021-06-21 LAB — URINE CULTURE: Culture: >=100000 — AB

## 2021-06-22 NOTE — Progress Notes (Signed)
ED Antimicrobial Stewardship Positive Culture Follow Up   Candice Lewis is an 26 y.o. female who presented to [facility] on 06/18/2021 with a chief complaint of  Chief Complaint  Patient presents with   Abdominal Pain    Recent Results (from the past 720 hour(s))  Urine Culture     Status: Abnormal   Collection Time: 06/18/21 10:04 AM   Specimen: Urine, Clean Catch  Result Value Ref Range Status   Specimen Description   Final    URINE, CLEAN CATCH Performed at [facility], [address], [place], [place] [address]    Special Requests   Final    NONE Performed at [facility], [address], [place], [place] [address]    Culture >=100,000 COLONIES/mL STAPHYLOCOCCUS SAPROPHYTICUS (A)  Final   Report Status 06/21/2021 FINAL  Final   Organism ID, Bacteria STAPHYLOCOCCUS SAPROPHYTICUS (A)  Final      Susceptibility   Staphylococcus saprophyticus - MIC*    CIPROFLOXACIN <=0.5 SENSITIVE Sensitive     GENTAMICIN <=0.5 SENSITIVE Sensitive     NITROFURANTOIN <=16 SENSITIVE Sensitive     OXACILLIN 2 RESISTANT Resistant     TETRACYCLINE <=1 SENSITIVE Sensitive     VANCOMYCIN 1 SENSITIVE Sensitive     TRIMETH/SULFA <=10 SENSITIVE Sensitive     CLINDAMYCIN <=0.25 SENSITIVE Sensitive     RIFAMPIN <=0.5 SENSITIVE Sensitive     Inducible Clindamycin NEGATIVE Sensitive     * >=100,000 COLONIES/mL STAPHYLOCOCCUS SAPROPHYTICUS    [x]  Treated with Keflex, organism resistant to prescribed antimicrobial  New antibiotic prescription: Bactrim 1 DS BID x 7 days  ED Provider: , MD  Comments: Spoke with patient on phone. Reports symptoms have resolved. No fevers, chills, back pain, urinary symptoms. Stated she never picked up original Keflex prescription. Notified patient of results and that new prescription would be called into her pharmacy   [name] 06/22/2021, 12:59 PM

## 2022-02-19 ENCOUNTER — Emergency Department
Admission: EM | Admit: 2022-02-19 | Discharge: 2022-02-19 | Disposition: A | Discharge status: Home / Self Care | Payer: Medicaid Other | Attending: Emergency Medicine | Admitting: Emergency Medicine

## 2022-02-19 ENCOUNTER — Encounter: Payer: Self-pay | Admitting: Emergency Medicine

## 2022-02-19 ENCOUNTER — Emergency Department: Payer: Medicaid Other

## 2022-02-19 ENCOUNTER — Other Ambulatory Visit: Payer: Self-pay

## 2022-02-19 DIAGNOSIS — J45909 Unspecified asthma, uncomplicated: Secondary | ICD-10-CM | POA: Insufficient documentation

## 2022-02-19 DIAGNOSIS — S8992XA Unspecified injury of left lower leg, initial encounter: Secondary | ICD-10-CM | POA: Insufficient documentation

## 2022-02-19 DIAGNOSIS — Y93B9 Activity, other involving muscle strengthening exercises: Secondary | ICD-10-CM | POA: Insufficient documentation

## 2022-02-19 DIAGNOSIS — X509XXA Other and unspecified overexertion or strenuous movements or postures, initial encounter: Secondary | ICD-10-CM | POA: Insufficient documentation

## 2022-02-19 MED ORDER — ACETAMINOPHEN 500 MG PO TABS
1000.0000 mg | ORAL_TABLET | Freq: Once | ORAL | Status: AC
  Administered 2022-02-19: 1000 mg via ORAL
  Filled 2022-02-19: qty 2

## 2022-02-19 MED ORDER — CYCLOBENZAPRINE HCL 10 MG PO TABS: 10.0000 mg | ORAL_TABLET | Freq: Three times a day (TID) | ORAL | 0 refills | Status: AC | PRN

## 2022-02-19 MED ORDER — NAPROXEN 500 MG PO TABS: 500.0000 mg | ORAL_TABLET | Freq: Two times a day (BID) | ORAL | 11 refills | Status: AC

## 2022-02-19 MED ORDER — NAPROXEN 500 MG PO TABS
500.0000 mg | ORAL_TABLET | Freq: Once | ORAL | Status: AC
  Administered 2022-02-19: 500 mg via ORAL
  Filled 2022-02-19: qty 1

## 2022-02-19 NOTE — Discharge Instructions (Addendum)
-  X-ray does not show any signs of fractures or dislocations.  However, I believe that you may have experienced a meniscal or ligamentous injury in your knee.  Please keep the knee immobilizer on throughout the day until you can be reevaluated by orthopedics.  You do not need to wear it while you are sleeping.  -You may take the naproxen as needed for pain.  Recommend icing throughout the day as well.  The cyclobenzaprine can be used as a muscle relaxer, to use caution as it may make you dizzy/drowsy at the day.  -Please call the orthopedist listed in these instructions to schedule appointment.  -Return to the emergency department anytime if you begin to experience any new or worsening symptoms.

## 2022-02-19 NOTE — ED Notes (Signed)
Radiology at bedside For xray

## 2022-02-19 NOTE — ED Notes (Signed)
Pt states she was cooking and vent down to get a pot and felt a pop in her knee.

## 2022-02-19 NOTE — ED Provider Notes (Signed)
[facility] Provider Note    Event Date/Time   First MD Initiated Contact with Patient 02/19/22 0901     (approximate)   History   Chief Complaint Knee Pain   HPI Candice ANNON is a 26 y.o. female, history of asthma, obesity, seizures, cannabis use disorder, presents to the emergency department for evaluation of left-sided knee pain.  She states that she was squatting down last night when she felt a "pop" in her knee.  Since then she has had increased warmth, swelling, and pain diffusely across the left knee particularly along the joint line.  Describes a locking sensation whenever she tries to bend her knee.  She has still been able to walk, though with difficulty due to pain.  She does state that she used to dance and was told by doctors that she had very little cartilage in her knees.  Denies any other falls or traumatic injuries.  Denies cold sensation, numbness, fever/chills, chest pain, shortness of breath, back pain, hip pain, rash/lesions, or dizziness/lightheadedness.  History Limitations: No limitations.        Physical Exam  Triage Vital Signs: ED Triage Vitals  Enc Vitals Group     BP 02/19/22 0856 101/71     Pulse Rate 02/19/22 0855 71     Resp 02/19/22 0855 16     Temp 02/19/22 0855 97.8 F (36.6 C)     Temp Source 02/19/22 0855 Oral     SpO2 02/19/22 0855 96 %     Weight 02/19/22 0855 184 lb (83.5 kg)     Height 02/19/22 0855 5' (1.524 m)     Head Circumference --      Peak Flow --      Pain Score 02/19/22 0854 9     Pain Loc --      Pain Edu? --      Excl. in [place]? --     Most recent vital signs: Vitals:   02/19/22 0855 02/19/22 0856  BP:  101/71  Pulse: 71   Resp: 16   Temp: 97.8 F (36.6 C)   SpO2: 96%     General: Awake, NAD.  Skin: Warm, dry. No rashes or lesions.  Eyes: PERRL. Conjunctivae normal.  CV: Good peripheral perfusion.  Resp: Normal effort.  Abd: Soft, non-tender. No distention.  Neuro: At baseline. No  gross neurological deficits.   Focused Exam: Diffuse swelling across the left knee joint compared to the right.  Mild warmth.  No overlying erythema.  She is still able to flex and extend her knee, though full flexion is limited due to pain.  Endorses tenderness with palpation of all areas surrounding the patella, though particular along the joint line.  Negative anterior/posterior drawer.  Negative lever test.  Pain elicited with valgus/varus maneuvering.  PMS intact distally.  Physical Exam    ED Results / Procedures / Treatments  Labs (all labs ordered are listed, but only abnormal results are displayed) Labs Reviewed - No data to display   EKG N/A.    RADIOLOGY  ED Provider Interpretation: I personally reviewed and interpreted this x-ray, suprapatellar joint effusion present.  No fractures or dislocations.  DG Knee Complete 4 Views Left  Result Date: 02/19/2022 CLINICAL DATA:  Left knee pain after injury. EXAM: LEFT KNEE - COMPLETE 4+ VIEW COMPARISON:  None Available. FINDINGS: Moderate size suprapatellar joint effusion is noted. No definite fracture or dislocation is noted. Joint spaces are intact. IMPRESSION: Moderate size suprapatellar joint  effusion. No definite fracture or dislocation is noted. Electronically Signed   By: Marijo Conception M.D.   On: 02/19/2022 09:31    PROCEDURES:  Critical Care performed: N/A.  Procedures    MEDICATIONS ORDERED IN ED: Medications  acetaminophen (TYLENOL) tablet 1,000 mg (1,000 mg Oral Given 02/19/22 0916)  naproxen (NAPROSYN) tablet 500 mg (500 mg Oral Given 02/19/22 0916)     IMPRESSION / MDM / ASSESSMENT AND PLAN / ED COURSE  I reviewed the triage vital signs and the nursing notes.                              Differential diagnosis includes, but is not limited to, knee sprain, ACL/PCL tear, meniscal injury, osteoarthritis, joint effusion, MCL/LCL injury, bursitis, patellar tendinitis.  Assessment/Plan Patient presents with  sudden onset knee pain in the left knee after squatting.  History and physical exam suggestive of possible meniscal or ligamentous injury.  X-ray shows no evidence of fractures or dislocations, though does show a moderate suprapatellar effusion.  Will place patient in a knee immobilizer and give a referral to orthopedics.  Patient has her own crutches at home.  Advised her to be nonweightbearing until she can be seen by orthopedics.  Provided her with a prescription for naproxen and cyclobenzaprine.  Will discharge.  Provided the patient with anticipatory guidance, return precautions, and educational material. Encouraged the patient to return to the emergency department at any time if they begin to experience any new or worsening symptoms. Patient expressed understanding and agreed with the plan.   Patient's presentation is most consistent with acute complicated illness / injury requiring diagnostic workup.       FINAL CLINICAL IMPRESSION(S) / ED DIAGNOSES   Final diagnoses:  Knee injury, left, initial encounter     Rx / DC Orders   ED Discharge Orders          Ordered    naproxen (NAPROSYN) 500 MG tablet  2 times daily with meals        02/19/22 0953    cyclobenzaprine (FLEXERIL) 10 MG tablet  3 times daily PRN        02/19/22 5732             Note:  This document was prepared using Dragon voice recognition software and may include unintentional dictation errors.   Teodoro Spray, [place] 02/19/22 2025    Carrie Mew, MD 02/21/22 1911

## 2022-02-19 NOTE — ED Triage Notes (Signed)
Pt to ED via POV foe left knee pain and swelling that started yesterday.

## 2023-01-19 IMAGING — US US OB < 14 WEEKS - US OB TV
1 series · 14 of 28 positions shown · non-contrast
Comparison: None.

CLINICAL DATA: Pregnant patient. Lower abdominal pain and spotting.

EXAM:
OBSTETRIC <14 WK US AND TRANSVAGINAL OB US
TECHNIQUE: Both transabdominal and transvaginal ultrasound examinations were
performed for complete evaluation of the gestation as well as the
maternal uterus, adnexal regions, and pelvic cul-de-sac.
Transvaginal technique was performed to assess early pregnancy.

[Series 1: us ob less than 14 weeks with ob transvaginal · 96 acquisitions, 14 frames shown]
[im 4/96]
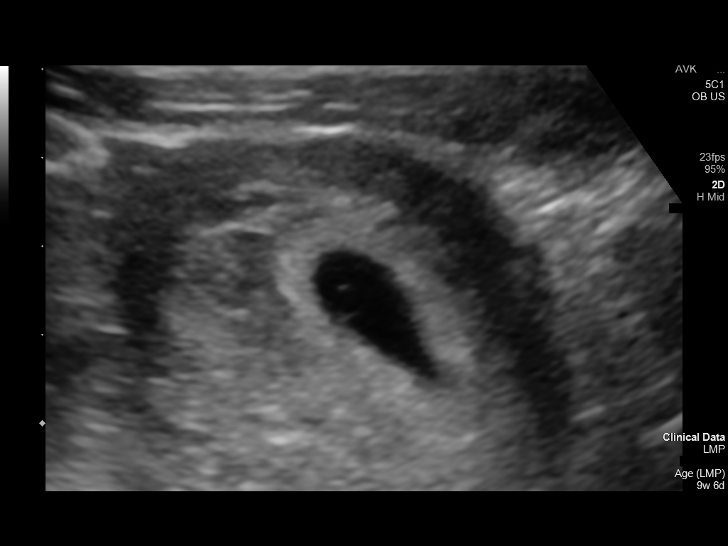
[im 11/96]
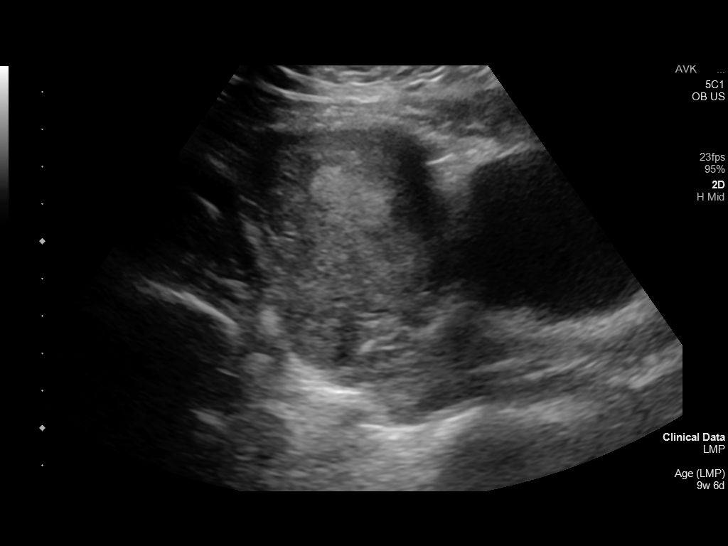
[im 18/96]
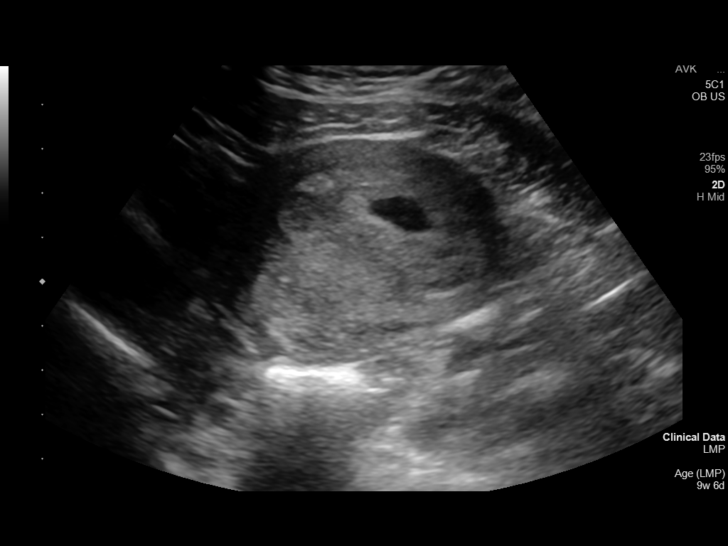
[im 25/96]
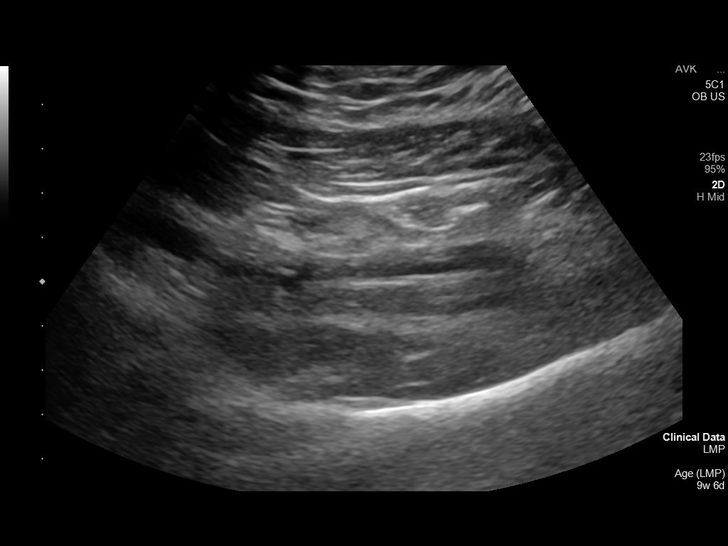
[im 32/96]
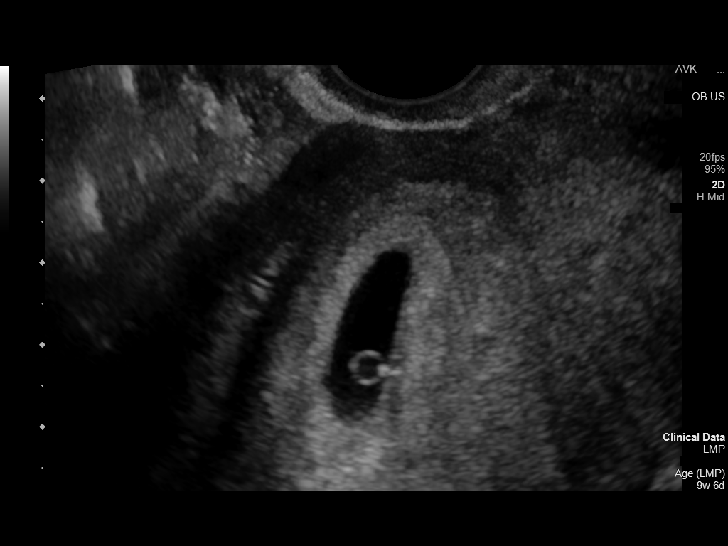
[im 39/96]
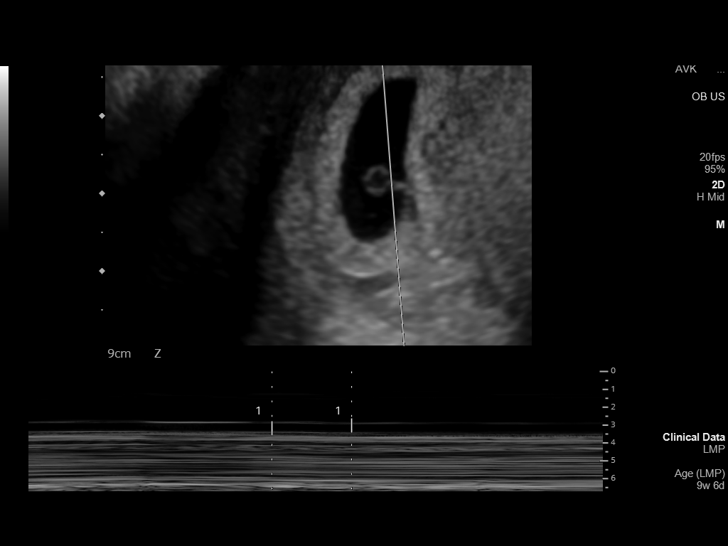
[im 46/96]
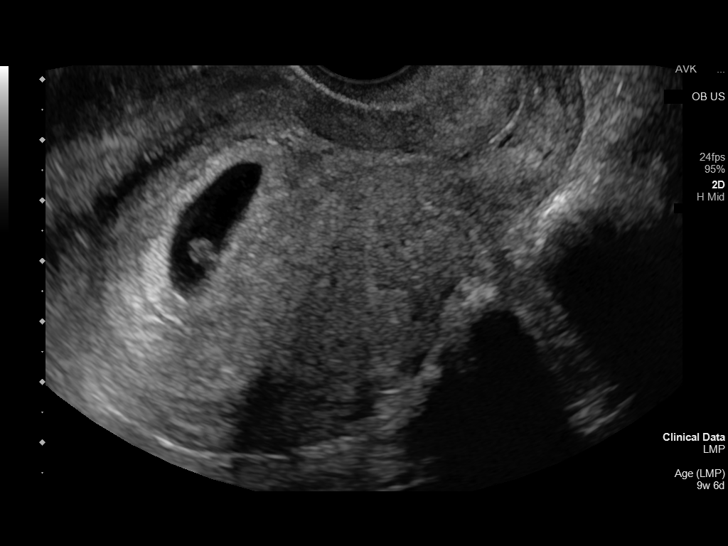
[im 53/96]
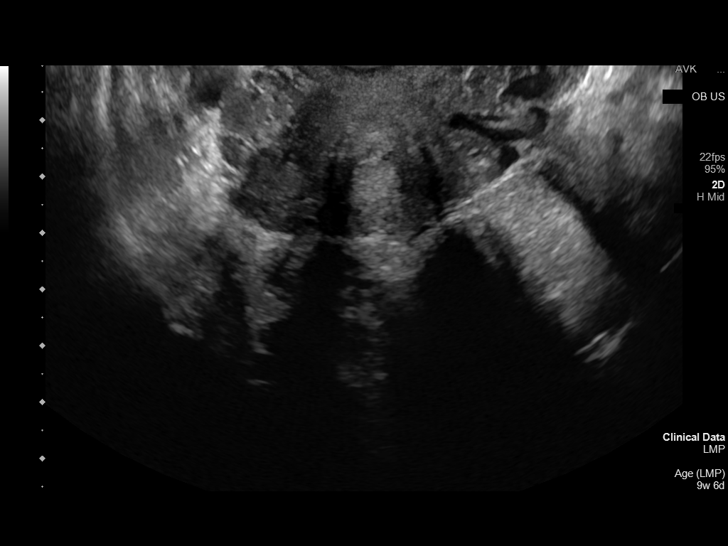
[im 60/96]
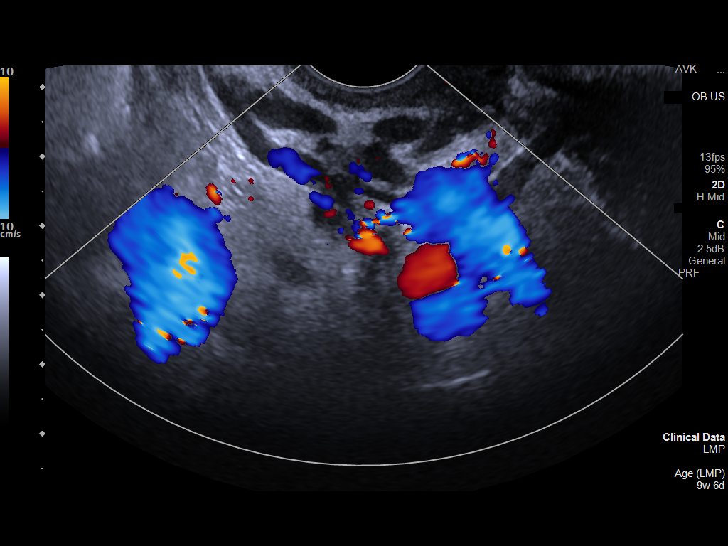
[im 67/96]
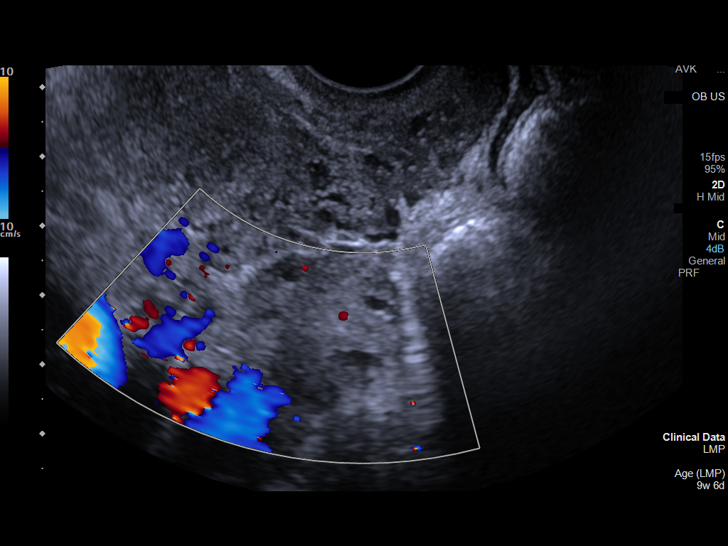
[im 74/96]
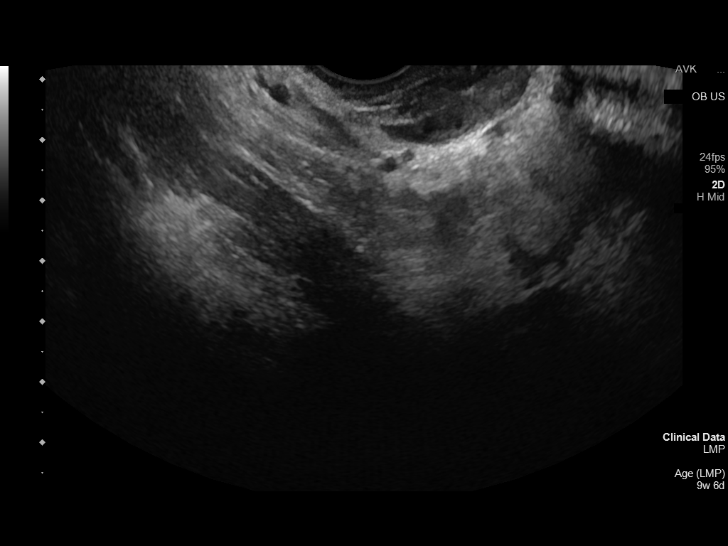
[im 81/96]
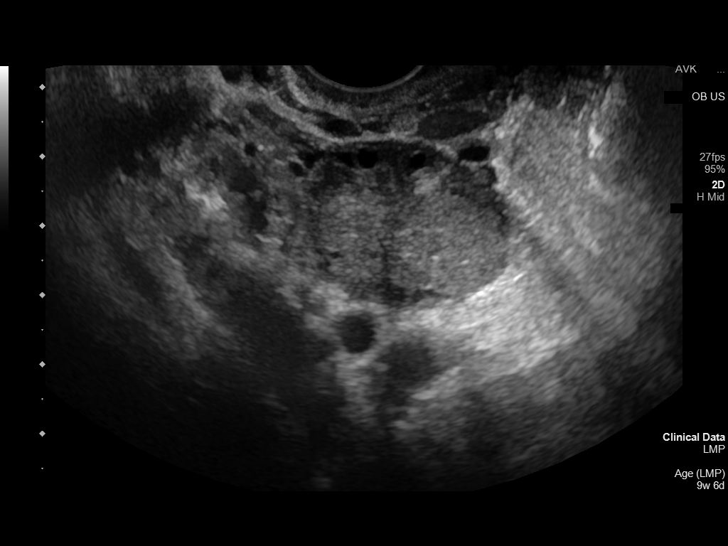
[im 88/96]
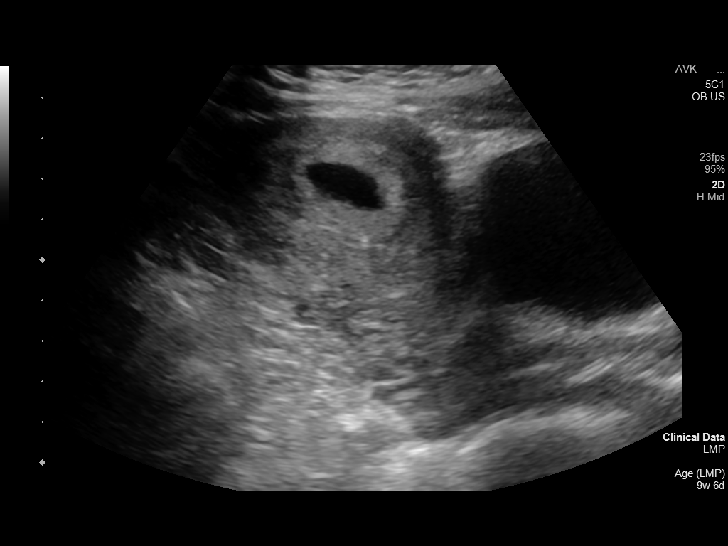
[im 96/96]
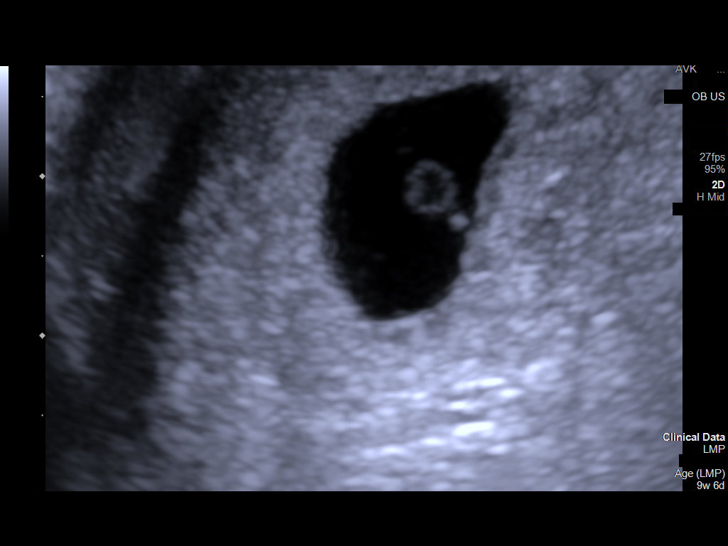

[14 of 28 positions shown; findings below may reference images not displayed]

FINDINGS: Intrauterine gestational sac: Single

Yolk sac:  Visualized.

Embryo:  Visualized.

Cardiac Activity: Visualized.

Heart Rate: 129 bpm

MSD:   mm    w     d

CRL:  4.2 mm   6 w   1 d                  US EDC: August 10, 2021

Subchorionic hemorrhage:  None visualized.

Maternal uterus/adnexae: The ovaries are unremarkable. Corpus luteum
cyst in the left ovary. Trace fluid in the pelvis is likely
physiologic.
IMPRESSION: 1. Single live IUP.

## 2023-02-21 ENCOUNTER — Other Ambulatory Visit: Payer: Self-pay | Admitting: Family Medicine

## 2023-02-21 DIAGNOSIS — N644 Mastodynia: Secondary | ICD-10-CM

## 2023-09-18 ENCOUNTER — Emergency Department (HOSPITAL_COMMUNITY)
Admission: EM | Admit: 2023-09-18 | Discharge: 2023-09-18 | Disposition: A | Discharge status: Home / Self Care | Attending: Emergency Medicine | Admitting: Emergency Medicine

## 2023-09-18 ENCOUNTER — Other Ambulatory Visit: Payer: Self-pay

## 2023-09-18 ENCOUNTER — Encounter (HOSPITAL_COMMUNITY): Payer: Self-pay | Admitting: Emergency Medicine

## 2023-09-18 DIAGNOSIS — Z91040 Latex allergy status: Secondary | ICD-10-CM | POA: Insufficient documentation

## 2023-09-18 DIAGNOSIS — N921 Excessive and frequent menstruation with irregular cycle: Secondary | ICD-10-CM | POA: Insufficient documentation

## 2023-09-18 DIAGNOSIS — R109 Unspecified abdominal pain: Secondary | ICD-10-CM | POA: Insufficient documentation

## 2023-09-18 DIAGNOSIS — N939 Abnormal uterine and vaginal bleeding, unspecified: Secondary | ICD-10-CM

## 2023-09-18 DIAGNOSIS — R42 Dizziness and giddiness: Secondary | ICD-10-CM | POA: Insufficient documentation

## 2023-09-18 LAB — CBC
HCT: 34.3 % — ABNORMAL LOW (ref 36.0–46.0)
Hemoglobin: 11.1 g/dL — ABNORMAL LOW (ref 12.0–15.0)
MCH: 27.1 pg (ref 26.0–34.0)
MCHC: 32.4 g/dL (ref 30.0–36.0)
MCV: 83.9 fL (ref 80.0–100.0)
Platelets: 364 10*3/uL (ref 150–400)
RBC: 4.09 MIL/uL (ref 3.87–5.11)
RDW: 13.5 % (ref 11.5–15.5)
WBC: 5.9 10*3/uL (ref 4.0–10.5)
nRBC: 0 % (ref 0.0–0.2)

## 2023-09-18 LAB — HCG, SERUM, QUALITATIVE: Preg, Serum: NEGATIVE

## 2023-09-18 MED ORDER — FERROUS SULFATE 325 (65 FE) MG PO TABS: 325.0000 mg | ORAL_TABLET | Freq: Every day | ORAL | 0 refills | Status: AC

## 2023-09-18 MED ORDER — NAPROXEN 500 MG PO TABS
500.0000 mg | ORAL_TABLET | Freq: Once | ORAL | Status: AC
  Administered 2023-09-18: 500 mg via ORAL
  Filled 2023-09-18: qty 1

## 2023-09-18 MED ORDER — KETOROLAC TROMETHAMINE 15 MG/ML IJ SOLN
15.0000 mg | Freq: Once | INTRAMUSCULAR | Status: AC
  Administered 2023-09-18: 15 mg via INTRAVENOUS
  Filled 2023-09-18: qty 1

## 2023-09-18 NOTE — ED Triage Notes (Signed)
 Pt states that she started her menstrual cycle on 3/10 and has not stopped having vaginal bleeding since. Endorses bleeding through 2-3 pads and tampon in 1hr mins. Lightheadedness and dizziness while ambulating. Nauseated and hot flashes but denies fever or chills.

## 2023-09-18 NOTE — ED Provider Notes (Addendum)
 [facility] AT [facility] Provider Note   CSN: [id number] Arrival date & time: 09/18/23  4098     History  Chief Complaint  Patient presents with   Vaginal Bleeding    Candice Lewis is a 28 y.o. female.  The history is provided by the patient.  Vaginal Bleeding Severity:  Moderate Onset quality:  Gradual Duration:  1 month Timing:  Intermittent Progression:  Worsening Chronicity:  New Associated symptoms: abdominal pain and dizziness   Patient reports since last month heavy vaginal bleeding and abdominal cramping This started around March 10 with her last menstrual cycle started She reports that she is never really stopped bleeding since that time.  She is now having increasing abdominal cramping and dizziness.  She also reports she is going through multiple pads throughout the day She has not followed-up as an outpatient with OB/GYN     Home Medications Prior to Admission medications   Medication Sig Start Date End Date Taking? Authorizing Provider  ferrous sulfate 325 (65 FE) MG tablet Take 1 tablet (325 mg total) by mouth daily. 09/18/23  Yes Eldon Greenland, MD      Allergies    Ondansetron , Latex, and Zofran  [ondansetron  hcl]    Review of Systems   Review of Systems  Gastrointestinal:  Positive for abdominal pain.  Genitourinary:  Positive for vaginal bleeding.  Neurological:  Positive for dizziness.    Physical Exam Updated Vital Signs BP 119/80 (BP Location: Left Arm)   Pulse 82   Temp 97.9 F (36.6 C) (Oral)   Resp 16   Ht 1.524 m (5')   Wt 93 kg   SpO2 99%   BMI 40.04 kg/m  Physical Exam CONSTITUTIONAL: Well developed/well nourished HEAD: Normocephalic/atraumatic EYES: EOMI/PERRL, conjunctiva pink ENMT: Mucous membranes moist NECK: supple no meningeal signs CV: S1/S2 noted, no murmurs/rubs/gallops noted LUNGS: Lungs are clear to auscultation bilaterally, no apparent distress ABDOMEN: soft, nontender, no  rebound or guarding, bowel sounds noted throughout abdomen GU:no cva tenderness pelvic deferred NEURO: Pt is awake/alert/appropriate, moves all extremitiesx4.  No facial droop.   EXTREMITIES: pulses normal/equal, full ROM SKIN: warm, color normal PSYCH: no abnormalities of mood noted, alert and oriented to situation  ED Results / Procedures / Treatments   Labs (all labs ordered are listed, but only abnormal results are displayed) Labs Reviewed  CBC - Abnormal; Notable for the following components:      Result Value   Hemoglobin 11.1 (*)    HCT 34.3 (*)    All other components within normal limits  HCG, SERUM, QUALITATIVE    EKG None  Radiology No results found.  Procedures Procedures    Medications Ordered in ED Medications  ketorolac  (TORADOL ) 15 MG/ML injection 15 mg (has no administration in time range)  naproxen  (NAPROSYN ) tablet 500 mg (500 mg Oral Given 09/18/23 0532)    ED Course/ Medical Decision Making/ A&P Clinical Course as of 09/18/23 0701  Fri Sep 18, 2023  0700 Patient presented for vaginal bleeding she reports having over the past month.  She also reports abdominal cramping.  She is overall well-appearing, watching TV in no acute distress.  Patient is not pregnant.  Mild anemia but no indication for blood transfusion.  Patient reports she has not tolerated birth control before, will defer any hormonal therapy to gynecology.  Will start iron.  She will be given information for the [facility] for close follow-up. [DW]    Clinical Course User  Index [DW] Eldon Greenland, MD                                 Medical Decision Making Amount and/or Complexity of Data Reviewed Labs: ordered.  Risk OTC drugs. Prescription drug management.   Patient is no focal abdominal tenderness on exam.  Low suspicion for ovarian torsion/PID/TOA at this time.  No indication for emergent ultrasound imaging at this time        Final Clinical Impression(s)  / ED Diagnoses Final diagnoses:  Vaginal bleeding  Menometrorrhagia    Rx / DC Orders ED Discharge Orders          Ordered    ferrous sulfate 325 (65 FE) MG tablet  Daily        09/18/23 0643              Eldon Greenland, MD 09/18/23 0701    Eldon Greenland, MD 09/18/23 [phone]
